# Patient Record
Sex: Male | Born: 1965
Health system: Southern US, Community
[De-identification: ages and names within clinical notes are randomized; demographics above are authoritative.]

## PROBLEM LIST (undated history)

## (undated) DIAGNOSIS — I10 Essential (primary) hypertension: Secondary | ICD-10-CM

## (undated) DIAGNOSIS — M1990 Unspecified osteoarthritis, unspecified site: Secondary | ICD-10-CM

## (undated) DIAGNOSIS — T7840XA Allergy, unspecified, initial encounter: Secondary | ICD-10-CM

## (undated) HISTORY — DX: Allergy, unspecified, initial encounter: T78.40XA

## (undated) HISTORY — DX: Unspecified osteoarthritis, unspecified site: M19.90

## (undated) HISTORY — DX: Essential (primary) hypertension: I10

---

## 2001-02-24 HISTORY — PX: CHOLECYSTECTOMY: SHX55

## 2016-06-03 ENCOUNTER — Ambulatory Visit (INDEPENDENT_AMBULATORY_CARE_PROVIDER_SITE_OTHER): Payer: 59 | Admitting: Family Medicine

## 2016-06-03 ENCOUNTER — Encounter: Payer: Self-pay | Admitting: Family Medicine

## 2016-06-03 VITALS — BP 140/84 | HR 99 | Resp 12 | Ht 70.0 in | Wt 203.4 lb

## 2016-06-03 DIAGNOSIS — G4733 Obstructive sleep apnea (adult) (pediatric): Secondary | ICD-10-CM | POA: Insufficient documentation

## 2016-06-03 DIAGNOSIS — I10 Essential (primary) hypertension: Secondary | ICD-10-CM

## 2016-06-03 DIAGNOSIS — E049 Nontoxic goiter, unspecified: Secondary | ICD-10-CM

## 2016-06-03 MED ORDER — AMLODIPINE BESYLATE 5 MG PO TABS: 5.0000 mg | ORAL_TABLET | Freq: Every day | ORAL | 0 refills | Status: DC

## 2016-06-03 NOTE — Progress Notes (Signed)
HPI:   [name] is a 51 y.o. male, who is here today to establish care with me.  Former PCP: Dr Omar Person Last preventive routine visit: 07/2015 pre-employment evaluation.He had lab work done but not sure about what was checked or results. He moved from [place] a year ago. He is a nurse,works with dialysis team.   Chronic medical problems: HTN,OSA,knee OA among some.    Concerns today: Elevated BP.   He has history of hypertension, he discontinued Amlodipine 10 mg about 8 months ago because his BP was well-controlled with nonpharmacologic treatment. He was running daily and following a low salt diet consistently.  For the past few months he has not been able to exercise due to bilateral knee pain, he has not been consistent with a healthy diet, as noted some weight gain. He is also following a high salt diet.  BP has been elevated for the past few days:140s 70/104, 130s/87, and yesterday right after doing yard work 117/70's.  No side effects reported with Amlodipine.  He has not noted visual changes, exertional chest pain, dyspnea,  focal weakness, or edema. Last eye exam June 2017. A few days ago he had a mild headache. For the past week or so he started with fatigue, which improved some with CPAP.  OSA: Diagnosed a few years ago. Last sleep study about 1-2 years ago. He stopped using CPAP because he felt more energetic and his wife noted he was sleeping well;this was after weight loss. He resumed CPAP  about a week ago because started with morning fatigue.  He has not tolerated CPAP, he feels like it interferes with sleep, he wakes up frequently during the night to re-accomodate mask.    Review of Systems  Constitutional: Positive for fatigue. Negative for activity change, appetite change, fever and unexpected weight change.  HENT: Negative for mouth sores, nosebleeds, sore throat and trouble swallowing.   Eyes: Negative for redness and visual  disturbance.  Respiratory: Negative for cough, shortness of breath and wheezing.   Cardiovascular: Negative for chest pain, palpitations and leg swelling.  Gastrointestinal: Negative for abdominal pain, nausea and vomiting.       No changes in bowel habits.  Genitourinary: Negative for decreased urine volume and hematuria.  Musculoskeletal: Positive for arthralgias (Hx of knee OA). Negative for gait problem and joint swelling.  Skin: Negative for rash.  Neurological: Negative for syncope, weakness and numbness.  Psychiatric/Behavioral: Positive for sleep disturbance. Negative for confusion. The patient is not nervous/anxious.       No current outpatient prescriptions on file prior to visit.   No current facility-administered medications on file prior to visit.      Past Medical History:  Diagnosis Date  . Arthritis    Knee OA  . Hypertension    No Known Allergies  Family History  Problem Relation Age of Onset  . Stroke Mother     aneurysm  . Hypertension Mother   . Hypertension Father     Social History   Social History  . Marital status: Married    Spouse name: N/A  . Number of children: N/A  . Years of education: N/A   Social History Main Topics  . Smoking status: Never Smoker  . Smokeless tobacco: Never Used  . Alcohol use Yes     Comment: Occasional  . Drug use: No  . Sexual activity: Not Asked   Other Topics Concern  . None   Social History Narrative  .  None    Vitals:   06/03/16 1146  BP: 140/84  Pulse: 99  Resp: 12  O2 sat 97% at RA.  Body mass index is 29.18 kg/m.   Physical Exam  Nursing note and vitals reviewed. Constitutional: He is oriented to person, place, and time. He appears well-developed. No distress.  HENT:  Head: Atraumatic.  Mouth/Throat: Oropharynx is clear and moist and mucous membranes are normal.  Eyes: Conjunctivae and EOM are normal. Pupils are equal, round, and reactive to light.  Neck: No tracheal deviation  present. Thyromegaly (? right thyroid.) present.  Cardiovascular: Normal rate and regular rhythm.   No murmur heard. Pulses:      Dorsalis pedis pulses are 2+ on the right side, and 2+ on the left side.  Respiratory: Effort normal and breath sounds normal. No respiratory distress.  GI: Soft. He exhibits no mass. There is no hepatomegaly. There is no tenderness.  Musculoskeletal: He exhibits edema (trace pitting edema,LE,bilateral.). He exhibits no tenderness.  Lymphadenopathy:    He has no cervical adenopathy.  Neurological: He is alert and oriented to person, place, and time. He has normal strength. No cranial nerve deficit. Coordination and gait normal.  Skin: Skin is warm. No erythema.  Psychiatric: He has a normal mood and affect.  Well groomed, good eye contact.      ASSESSMENT AND PLAN:   Erik Rubio was seen today for establish care and hypertension.  Diagnoses and all orders for this visit:  OSA (obstructive sleep apnea)  He is aware of adverse effects of sleep apnea when not treated adequately. Appointment with Dr. Waynette Buttery will be arranged. Encouraged to wear CPAP daily.   -     Ambulatory referral to Pulmonology  Hypertension, essential, benign  Mildly elevated. Possible complications of elevated BP discussed. Annual eye examination. Amlodipine 5 mg daily recommended. Low salt diet. He will keep me inform about BP readings through My Chart. I will se him back in 5-6 months.  -     Basic metabolic panel; Future -     TSH; Future -     Lipid panel; Future -     amLODipine (NORVASC) 5 MG tablet; Take 1 tablet (5 mg total) by mouth daily.  Enlarged thyroid gland  We discussed the findings on examination today. He would like to hold on thyroid imaging and proceed according to lab results.   -     TSH; Future        Betty G. Martinique, MD  [facility]. [place] office.

## 2016-06-03 NOTE — Progress Notes (Signed)
Pre visit review using our clinic review tool, if applicable. No additional management support is needed unless otherwise documented below in the visit note. 

## 2016-06-03 NOTE — Patient Instructions (Signed)
A few things to remember from today's visit:   OSA (obstructive sleep apnea) - Plan: Ambulatory referral to Pulmonology  Hypertension, essential, benign - Plan: Basic metabolic panel, TSH, Lipid panel, amLODipine (NORVASC) 5 MG tablet   DASH diet recommended: high in vegetables, fruits, low-fat dairy products, whole grains, poultry, fish, and nuts; and low in sweets, sugar-sweetened beverages, and red meats.  Blood pressure goal for most people is less than 140/90. Some populations (older than 60) the goal is less than 150/90.  Most recent cardiologists' recommendations recommend blood pressure at or less than 130/80.   Elevated blood pressure increases the risk of strokes, heart and kidney disease, and eye problems. Regular physical activity and a healthy diet (DASH diet) usually help. Low salt diet. Take medications as instructed.  Caution with some over the counter medications as cold medications, dietary products (for weight loss), and Ibuprofen or Aleve (frequent use);all these medications could cause elevation of blood pressure.   Please be sure medication list is accurate. If a new problem present, please set up appointment sooner than planned today.

## 2016-06-04 MED FILL — AMLODIPINE BESYLATE 5 MG TA: 5 | 90 days supply | Qty: 90 | Fill #0

## 2016-06-05 ENCOUNTER — Other Ambulatory Visit (INDEPENDENT_AMBULATORY_CARE_PROVIDER_SITE_OTHER): Payer: 59

## 2016-06-05 DIAGNOSIS — I10 Essential (primary) hypertension: Secondary | ICD-10-CM

## 2016-06-05 DIAGNOSIS — E049 Nontoxic goiter, unspecified: Secondary | ICD-10-CM | POA: Diagnosis not present

## 2016-06-05 LAB — LIPID PANEL
CHOLESTEROL: 201 mg/dL — AB (ref 0–200)
HDL: 64.9 mg/dL (ref >39.00)
LDL CALC: 122 mg/dL — AB (ref 0–99)
NonHDL: 135.75
Total CHOL/HDL Ratio: 3
Triglycerides: 67 mg/dL (ref 0.0–149.0)
VLDL: 13.4 mg/dL (ref 0.0–40.0)

## 2016-06-05 LAB — TSH: TSH: 0.66 u[IU]/mL (ref 0.35–4.50)

## 2016-06-05 LAB — BASIC METABOLIC PANEL
BUN: 17 mg/dL (ref 6–23)
CHLORIDE: 101 meq/L (ref 96–112)
CO2: 29 mEq/L (ref 19–32)
Calcium: 9.4 mg/dL (ref 8.4–10.5)
Creatinine, Ser: 1.09 mg/dL (ref 0.40–1.50)
GFR: 76.58 mL/min (ref >60.00)
Glucose, Bld: 88 mg/dL (ref 70–99)
Potassium: 4.3 mEq/L (ref 3.5–5.1)
Sodium: 138 mEq/L (ref 135–145)

## 2016-06-06 ENCOUNTER — Encounter: Payer: Self-pay | Admitting: Family Medicine

## 2016-06-08 ENCOUNTER — Encounter: Payer: Self-pay | Admitting: Family Medicine

## 2016-11-11 ENCOUNTER — Encounter: Payer: 59 | Admitting: Family Medicine

## 2016-11-13 ENCOUNTER — Encounter: Payer: Self-pay | Admitting: Family Medicine

## 2016-11-20 NOTE — Progress Notes (Signed)
HPI:  [name]. is a 51 y.o.male here today for his routine physical examination.  Last CPE: 07/2015 He lives with his wife and 2 children.  Regular exercise 3 or more times per week: Not consistently. Following a healthy diet: Yes   Chronic medical problems: HTN,OSA not wearing CPAP,knee OA, and mild HLD.  She has not been able to tolerate CPAP machine. He denies morning headache or fatigue.  Hx of STD's: Yes   -Denies high alcohol intake, tobacco use, or Hx of illicit drug use.  -Concerns and/or follow up today:   She is concerned about elevated BP at home, mainly diastolic numbers in the low 90s. He is not following a low-salt diet. He is currently on Amlodipine 5 mg daily.  Denies  headache, visual changes, chest pain, dyspnea, palpitation, claudication, focal weakness, or edema.  HLD: He is on non pharmacologic treatment. Last FLP otherwise normal. She tries to follow low fat diet.  He would like it to be re-checked.  Review of Systems  Constitutional: Negative for activity change, appetite change, fatigue, fever and unexpected weight change.  HENT: Negative for dental problem, nosebleeds, sore throat, trouble swallowing and voice change.   Eyes: Negative for redness and visual disturbance.  Respiratory: Negative for apnea, cough, shortness of breath and wheezing.   Cardiovascular: Negative for chest pain, palpitations and leg swelling.  Gastrointestinal: Negative for abdominal pain, blood in stool, nausea and vomiting.  Endocrine: Negative for cold intolerance, heat intolerance, polydipsia, polyphagia and polyuria.  Genitourinary: Negative for decreased urine volume, dysuria, genital sores, hematuria and testicular pain.  Musculoskeletal: Negative for gait problem and myalgias.  Skin: Negative for color change and rash.  Allergic/Immunologic: Negative for environmental allergies.  Neurological: Negative for dizziness, syncope,  weakness and headaches.  Hematological: Negative for adenopathy. Does not bruise/bleed easily.  Psychiatric/Behavioral: Negative for confusion and sleep disturbance. The patient is not nervous/anxious.   All other systems reviewed and are negative.    No current outpatient prescriptions on file prior to visit.   No current facility-administered medications on file prior to visit.      Past Medical History:  Diagnosis Date  . Arthritis    Knee OA  . Hypertension     Past Surgical History:  Procedure Laterality Date  . CHOLECYSTECTOMY  2003    No Known Allergies  Family History  Problem Relation Age of Onset  . Stroke Mother        aneurysm  . Hypertension Mother   . Hypertension Father     Social History   Social History  . Marital status: Married    Spouse name: N/A  . Number of children: N/A  . Years of education: N/A   Social History Main Topics  . Smoking status: Never Smoker  . Smokeless tobacco: Never Used  . Alcohol use Yes     Comment: Occasional  . Drug use: No  . Sexual activity: Not Asked   Other Topics Concern  . None   Social History Narrative  . None     Vitals:   11/21/16 0652 11/21/16 0722  BP: 124/80 (!) 136/92  Pulse: 85   Resp: 12   SpO2: 98%    Body mass index is 28.03 kg/m.   Wt Readings from Last 3 Encounters:  11/21/16 195 lb 6 oz (88.6 kg)  06/03/16 203 lb 6 oz (92.3 kg)     Physical Exam  Nursing note and vitals reviewed. Constitutional:  He is oriented to person, place, and time. He appears well-developed. No distress.  HENT:  Head: Normocephalic and atraumatic.  Right Ear: Hearing, tympanic membrane, external ear and ear canal normal.  Left Ear: Hearing, tympanic membrane, external ear and ear canal normal.  Mouth/Throat: Oropharynx is clear and moist and mucous membranes are normal.  Eyes: Pupils are equal, round, and reactive to light. Conjunctivae and EOM are normal.  Neck: Normal range of motion. No  tracheal deviation present. Thyromegaly (? right) present.  Cardiovascular: Normal rate and regular rhythm.   No murmur heard. Pulses:      Dorsalis pedis pulses are 2+ on the right side, and 2+ on the left side.  Respiratory: Effort normal and breath sounds normal. No respiratory distress.  GI: Soft. He exhibits no mass. There is no tenderness.  Genitourinary:  Genitourinary Comments: Refused,no concerns.  Musculoskeletal: He exhibits no edema or tenderness.  No major deformities appreciated and no signs of synovitis.  Lymphadenopathy:    He has no cervical adenopathy.       Right: No supraclavicular adenopathy present.       Left: No supraclavicular adenopathy present.  Neurological: He is alert and oriented to person, place, and time. He has normal strength. No cranial nerve deficit or sensory deficit. Coordination and gait normal.  Reflex Scores:      Bicep reflexes are 2+ on the right side and 2+ on the left side.      Patellar reflexes are 2+ on the right side and 2+ on the left side. Skin: Skin is warm. No rash noted. No erythema.  Psychiatric: His mood appears anxious. Cognition and memory are normal.  Well groomed, good eye contact.     ASSESSMENT AND PLAN:   Erik Rubio was seen today for annual exam.  Diagnoses and all orders for this visit:  Lab Results  Component Value Date   ALT 30 11/21/2016   AST 20 11/21/2016   ALKPHOS 73 11/21/2016   BILITOT 0.7 11/21/2016   Lab Results  Component Value Date   CREATININE 1.14 11/21/2016   BUN 13 11/21/2016   NA 136 11/21/2016   K 4.1 11/21/2016   CL 100 11/21/2016   CO2 28 11/21/2016   Lab Results  Component Value Date   HGBA1C 5.6 11/21/2016   Lab Results  Component Value Date   CHOL 187 11/21/2016   HDL 63.60 11/21/2016   LDLCALC 108 (H) 11/21/2016   TRIG 80.0 11/21/2016   CHOLHDL 3 11/21/2016    Routine general medical examination at a health care facility  We discussed the importance of regular  physical activity and healthy diet for prevention of chronic illness and/or complications. Preventive guidelines reviewed. Vaccination up to date.  The 10-year ASCVD risk score Mikey Bussing DC Brooke Bonito., et al., 2013) is: 2.5%   Values used to calculate the score:     Age: 47 years     Sex: Male     Is Non-Hispanic African American: No     Diabetic: No     Tobacco smoker: No     Systolic Blood Pressure: 169 mmHg     Is BP treated: Yes     HDL Cholesterol: 63.6 mg/dL     Total Cholesterol: 187 mg/dL  Next CPE in 1 year.  Hyperlipidemia, unspecified hyperlipidemia type  Continue low fat diet. Further recommendations will be given according to FLP results. F/U in 12 months.  -     Lipid panel  Diabetes mellitus screening -  Comprehensive metabolic panel -     Hemoglobin A1c  Enlarged thyroid gland  ? Right nodule. Asymptomatic. Further recommendations will be given according to imaging results.  -     US THYROID; Future  Hypertension, essential, benign  Mildly elevated.  Possible complications of elevated BP discussed. After discussion of options, he would like to increase dose of Amlodipine. Monitor BP at home. DASH-Low salt diet also recommended.  Annual eye examination. F/U in 6 months.  -     amLODipine (NORVASC) 10 MG tablet; Take 1 tablet (10 mg total) by mouth daily.    Return in 6 months (on 05/21/2017) for HTN.    Betty G. Martinique, MD  [facility]. [place] office.

## 2016-11-21 ENCOUNTER — Ambulatory Visit (INDEPENDENT_AMBULATORY_CARE_PROVIDER_SITE_OTHER): Payer: 59 | Admitting: Family Medicine

## 2016-11-21 ENCOUNTER — Encounter: Payer: Self-pay | Admitting: Family Medicine

## 2016-11-21 VITALS — BP 136/92 | HR 85 | Resp 12 | Ht 70.0 in | Wt 195.4 lb

## 2016-11-21 DIAGNOSIS — E049 Nontoxic goiter, unspecified: Secondary | ICD-10-CM

## 2016-11-21 DIAGNOSIS — Z0001 Encounter for general adult medical examination with abnormal findings: Secondary | ICD-10-CM

## 2016-11-21 DIAGNOSIS — E785 Hyperlipidemia, unspecified: Secondary | ICD-10-CM

## 2016-11-21 DIAGNOSIS — I10 Essential (primary) hypertension: Secondary | ICD-10-CM | POA: Diagnosis not present

## 2016-11-21 DIAGNOSIS — Z131 Encounter for screening for diabetes mellitus: Secondary | ICD-10-CM

## 2016-11-21 DIAGNOSIS — Z0000 Encounter for general adult medical examination without abnormal findings: Secondary | ICD-10-CM

## 2016-11-21 LAB — COMPREHENSIVE METABOLIC PANEL
ALBUMIN: 4.6 g/dL (ref 3.5–5.2)
ALT: 30 U/L (ref 0–53)
AST: 20 U/L (ref 0–37)
Alkaline Phosphatase: 73 U/L (ref 39–117)
BUN: 13 mg/dL (ref 6–23)
CALCIUM: 9.5 mg/dL (ref 8.4–10.5)
CHLORIDE: 100 meq/L (ref 96–112)
CO2: 28 meq/L (ref 19–32)
Creatinine, Ser: 1.14 mg/dL (ref 0.40–1.50)
GFR: 72.58 mL/min (ref >60.00)
Glucose, Bld: 96 mg/dL (ref 70–99)
POTASSIUM: 4.1 meq/L (ref 3.5–5.1)
SODIUM: 136 meq/L (ref 135–145)
Total Bilirubin: 0.7 mg/dL (ref 0.2–1.2)
Total Protein: 7.1 g/dL (ref 6.0–8.3)

## 2016-11-21 LAB — LIPID PANEL
CHOLESTEROL: 187 mg/dL (ref 0–200)
HDL: 63.6 mg/dL (ref >39.00)
LDL CALC: 108 mg/dL — AB (ref 0–99)
NonHDL: 123.87
Total CHOL/HDL Ratio: 3
Triglycerides: 80 mg/dL (ref 0.0–149.0)
VLDL: 16 mg/dL (ref 0.0–40.0)

## 2016-11-21 LAB — HEMOGLOBIN A1C: HEMOGLOBIN A1C: 5.6 % (ref 4.6–6.5)

## 2016-11-21 MED ORDER — AMLODIPINE BESYLATE 10 MG PO TABS: 10.0000 mg | ORAL_TABLET | Freq: Every day | ORAL | 3 refills | Status: DC

## 2016-11-21 MED FILL — AMLODIPINE BESYLATE 10 MG T: 10 | 90 days supply | Qty: 90 | Fill #0

## 2016-11-21 NOTE — Patient Instructions (Addendum)
A few things to remember from today's visit:   Diabetes mellitus screening - Plan: Comprehensive metabolic panel, Hemoglobin A1c  Hyperlipidemia, unspecified hyperlipidemia type - Plan: Lipid panel  Enlarged thyroid gland - Plan: US THYROID  Routine general medical examination at a health care facility  Amlodipine increased.    At least 150 minutes of moderate exercise per week, daily brisk walking for 15-30 min is a good exercise option. Healthy diet low in saturated (animal) fats and sweets and consisting of fresh fruits and vegetables, lean meats such as fish and white chicken and whole grains.  - Vaccines:  Tdap vaccine every 10 years.  Shingles vaccine recommended at age 51, could be given after 49 years of age but not sure about insurance coverage.  Pneumonia vaccines:  Prevnar 42 at 37 and Pneumovax at 6.   -Screening recommendations for low/normal risk males:  Screening for diabetes at age 70 and every 3 years. Earlier screening if cardiovascular risk factors.    Lipid screening at 35 and every 3 years. Screening starts in younger males with cardiovascular risk factors.   Colon cancer screening at age 63 and until age 35.  Prostate cancer screening: some controversy, starts usually at 75: Rectal exam and PSA.  Aortic Abdominal Aneurism once between 62 and 41 years old if even smoker.      Please be sure medication list is accurate. If a new problem present, please set up appointment sooner than planned today.

## 2016-11-23 ENCOUNTER — Encounter: Payer: Self-pay | Admitting: Family Medicine

## 2016-12-03 ENCOUNTER — Ambulatory Visit: Payer: 59 | Admitting: Family Medicine

## 2016-12-03 ENCOUNTER — Encounter: Payer: Self-pay | Admitting: Family Medicine

## 2016-12-03 ENCOUNTER — Ambulatory Visit (INDEPENDENT_AMBULATORY_CARE_PROVIDER_SITE_OTHER): Payer: 59 | Admitting: Family Medicine

## 2016-12-03 VITALS — BP 126/80 | HR 73 | Resp 12 | Ht 70.0 in | Wt 195.4 lb

## 2016-12-03 DIAGNOSIS — R200 Anesthesia of skin: Secondary | ICD-10-CM | POA: Diagnosis not present

## 2016-12-03 DIAGNOSIS — M549 Dorsalgia, unspecified: Secondary | ICD-10-CM | POA: Diagnosis not present

## 2016-12-03 DIAGNOSIS — I10 Essential (primary) hypertension: Secondary | ICD-10-CM

## 2016-12-03 MED ORDER — METHOCARBAMOL 750 MG PO TABS: 750.0000 mg | ORAL_TABLET | Freq: Three times a day (TID) | ORAL | 0 refills | Status: AC | PRN

## 2016-12-03 MED ORDER — CELECOXIB 100 MG PO CAPS: 100.0000 mg | ORAL_CAPSULE | Freq: Two times a day (BID) | ORAL | 0 refills | Status: DC

## 2016-12-03 MED ORDER — KETOROLAC TROMETHAMINE 60 MG/2ML IM SOLN
60.0000 mg | Freq: Once | INTRAMUSCULAR | Status: AC
  Administered 2016-12-03: 60 mg via INTRAMUSCULAR

## 2016-12-03 MED ORDER — PREDNISONE 20 MG PO TABS: ORAL_TABLET | ORAL | 0 refills | Status: AC

## 2016-12-03 MED ORDER — TRAMADOL HCL 50 MG PO TABS
50.0000 mg | ORAL_TABLET | Freq: Three times a day (TID) | ORAL | 0 refills | Status: DC | PRN
Start: 2016-12-03 — End: 2016-12-08

## 2016-12-03 MED FILL — predniSONE 20 MG TABS: 20 | 12 days supply | Qty: 20 | Fill #0

## 2016-12-03 MED FILL — METHOCARBAMOL 750 MG TABLET: 750 | 15 days supply | Qty: 45 | Fill #0

## 2016-12-03 MED FILL — traMADol HCL 50 MG TABS: 50 | 7 days supply | Qty: 21 | Fill #0

## 2016-12-03 MED FILL — CELECOXIB 100 MG CAP: 100 | 10 days supply | Qty: 20 | Fill #0

## 2016-12-03 NOTE — Progress Notes (Signed)
ACUTE VISIT   HPI:  Chief Complaint  Patient presents with  . upper back pain    [name] is a 51 y.o. male, who is here today with his wife complaining of constant left upper back pain.  Pain is started on 11/29/2016, a day after he Rubio working on cleaning his roof gutters at home. He does not remember pulling a muscle and denies any fall or injury. Pain got worse on 11/30/16.  No prior history of back pain.  Pain is left interscapular and radiated to LUE, dull/sharp like, 10/10 in intensity. Pain radiates to posterior aspect of left shoulder, arm,medial forearm,and 4th-5th finger.Pressure/numb like sensation. He also feel some weakness when grabbing objects with left hand.  Exacerbated by movement and when lying down, it is keeping him from sleep. Alleviated by rest. No rash or edema on area, fever, chills, or abnormal wt loss.  No limitation of ROM.  OTC medications: Motrin 800 mg tid ,last dose today 2 am. He also took his wife's Methocarbamol, she had leftover from prior Rx.  Pain is not better.  HTN: Recently Amlodipine Rubio increased from 5 to 10 mg daily. He has not checked BP lately.   Review of Systems  Constitutional: Positive for fatigue. Negative for appetite change, chills and fever.  HENT: Negative for sore throat and trouble swallowing.   Respiratory: Negative for cough, shortness of breath and wheezing.   Cardiovascular: Negative for chest pain.  Gastrointestinal: Negative for abdominal pain, nausea and vomiting.  Genitourinary: Negative for decreased urine volume and hematuria.  Musculoskeletal: Positive for back pain. Negative for gait problem and neck pain.  Skin: Negative for color change and rash.  Neurological: Negative for syncope, weakness, numbness and headaches.  Psychiatric/Behavioral: Positive for sleep disturbance. Negative for confusion. The patient is not nervous/anxious.       Current Outpatient  Prescriptions on File Prior to Visit  Medication Sig Dispense Refill  . amLODipine (NORVASC) 10 MG tablet Take 1 tablet (10 mg total) by mouth daily. 90 tablet 3   No current facility-administered medications on file prior to visit.      Past Medical History:  Diagnosis Date  . Arthritis    Knee OA  . Hypertension    No Known Allergies  Social History   Social History  . Marital status: Married    Spouse name: N/A  . Number of children: N/A  . Years of education: N/A   Social History Main Topics  . Smoking status: Never Smoker  . Smokeless tobacco: Never Used  . Alcohol use Yes     Comment: Occasional  . Drug use: No  . Sexual activity: Not Asked   Other Topics Concern  . None   Social History Narrative  . None    Vitals:   12/03/16 0935  BP: 126/80  Pulse: 73  Resp: 12  SpO2: 97%   Body mass index is 28.03 kg/m.  Physical Exam  Nursing note and vitals reviewed. Constitutional: He is oriented to person, place, and time. He appears well-developed. No distress.  HENT:  Head: Normocephalic and atraumatic.  Mouth/Throat: Oropharynx is clear and moist and mucous membranes are normal.  Eyes: Pupils are equal, round, and reactive to light. Conjunctivae are normal.  Cardiovascular: Normal rate and regular rhythm.   No murmur heard. Pulses:      Radial pulses are 2+ on the left side.  Respiratory: Effort normal and breath sounds normal. No respiratory  distress.  Musculoskeletal: He exhibits no edema.       Cervical back: He exhibits decreased range of motion. He exhibits no bony tenderness.       Thoracic back: He exhibits tenderness and spasm.       Back:  Minimal scoliosis appreciated on upper back. Tenderness upon palpation of left trapezium and interscapular left muscles.  Pain elicited with movement of shoulders and cervical ROM. Limitation of cervical extension due to pain, mild limited rotation and normal flexion. No local edema or erythema  appreciated, no suspicious lesions.   Lymphadenopathy:    He has no cervical adenopathy.  Neurological: He is alert and oriented to person, place, and time. He has normal strength. Coordination and gait normal.  Skin: Skin is warm. No rash noted. No erythema.  Psychiatric: His mood appears anxious.  Well groomed,good eye contact.    ASSESSMENT AND PLAN:   [name] seen today for upper back pain.  Diagnoses and all orders for this visit:  Acute upper back pain  ? Muscle strain. Because there is not a clear history of injury or blunt trauma, I don't think imaging is needed today. Here in the office and after verbal consent he received Toradol 60 mg IM. He will continue with Celebrex 100 mg twice daily for up to 10 days. Tramadol 50 mg to take mainly at night, side effects discussed. Topical Icy Hot or Asper cream OR local ice may also help. Instructed about warning signs. Cervical ROM exercises recommended. Follow-up as needed.  -     celecoxib (CELEBREX) 100 MG capsule; Take 1 capsule (100 mg total) by mouth 2 (two) times daily. -     methocarbamol (ROBAXIN) 750 MG tablet; Take 1 tablet (750 mg total) by mouth every 8 (eight) hours as needed for muscle spasms. -     traMADol (ULTRAM) 50 MG tablet; Take 1 tablet (50 mg total) by mouth every 8 (eight) hours as needed. -     ketorolac (TORADOL) injection 60 mg; Inject 2 mLs (60 mg total) into the muscle once.  Left arm numbness  ? Radicular pain. We discussed a few treatment options, including monitoring for now, he would like to try Prednisone taper. We discussed side effects. Instructed about warning signs. He is not better in 4-6 weeks we will need to consider cervical/upper thoracic MRI.  -     predniSONE (DELTASONE) 20 MG tablet; 3 tabs for 3 days, 2 tabs for 3 days, 1 tabs for 3 days, and 1/2 tab for 3 days. Take tables together with breakfast.  Hypertension, essential, benign  Adequately controlled. We  discussed side effects of NSAIDs. Recommended monitoring BP periodically. No changes in current management.   -[name] Rubio advised to seek immediate medical attention if sudden worsening symptoms or to follow if they persist or if new concerns arise.       Betty G. Martinique, MD  [facility]. [place] office.

## 2016-12-03 NOTE — Patient Instructions (Signed)
A few things to remember from today's visit:   Left arm numbness - Plan: predniSONE (DELTASONE) 20 MG tablet  Acute upper back pain - Plan: celecoxib (CELEBREX) 100 MG capsule, methocarbamol (ROBAXIN) 750 MG tablet, traMADol (ULTRAM) 50 MG tablet  Hypertension, essential, benign  Monitor blood pressure. If numbness still present in 4-6 weeks,please follow,we may need MRI.    Please be sure medication list is accurate. If a new problem present, please set up appointment sooner than planned today.

## 2016-12-08 ENCOUNTER — Encounter: Payer: Self-pay | Admitting: Family Medicine

## 2016-12-08 ENCOUNTER — Ambulatory Visit (INDEPENDENT_AMBULATORY_CARE_PROVIDER_SITE_OTHER): Payer: 59 | Admitting: Family Medicine

## 2016-12-08 VITALS — BP 160/90 | HR 102 | Resp 12 | Ht 70.0 in | Wt 195.1 lb

## 2016-12-08 DIAGNOSIS — M549 Dorsalgia, unspecified: Secondary | ICD-10-CM

## 2016-12-08 DIAGNOSIS — R200 Anesthesia of skin: Secondary | ICD-10-CM | POA: Diagnosis not present

## 2016-12-08 DIAGNOSIS — I10 Essential (primary) hypertension: Secondary | ICD-10-CM | POA: Diagnosis not present

## 2016-12-08 MED ORDER — HYDROCODONE-ACETAMINOPHEN 5-325 MG PO TABS: 1.0000 | ORAL_TABLET | Freq: Three times a day (TID) | ORAL | 0 refills | Status: AC | PRN

## 2016-12-08 MED ORDER — BENAZEPRIL HCL 10 MG PO TABS: 10.0000 mg | ORAL_TABLET | Freq: Every day | ORAL | 2 refills | Status: DC

## 2016-12-08 MED FILL — BENAZEPRIL HCL 10 MG TABLET: 10 | 30 days supply | Qty: 30 | Fill #0

## 2016-12-08 MED FILL — HYDROCODON-APAP 5-325: 5-325 | 7 days supply | Qty: 21 | Fill #0

## 2016-12-08 NOTE — Progress Notes (Signed)
HPI:   [name] is a 51 y.o. male, who is here today to follow on recent OV.   He was seen on 12/03/16 because upper back pain radiated to left shoulder and some numbness.  Pain started on 11/29/16. No Hx of direct trauma, he did pull a muscle on affected area while cleaning his roof gutters on 11/28/16.  No prior Hx of upper back pain or radiculopathy.  Celebrex 100 mg bid,Tramadol 50 mg tid,Prednisone taper, and Methocarbamol 750 mg tid were recommended. Tramadol is helping some with the pain but just for about 4 hours. Constant 3/10 when taking analgesics, 8-10/10 with no medication.  Numbness along left upper extremity (from posterior aspect of left shoulder to ulnar aspect of wrist and 4-5th fingers) is getting worse as well as grip weakness Pain is sharp exacerbated by laying down and with cervical movement. Alleviated by holding his left arm up. He denies chills, fever, skin rash, joint edema or erythema.  Pain is interfering with his sleep.  HTN: His BP is elevated today. BP readings at home BP 150/s/98. His currently on Amlodipine 10 mg daily. Denies severe/frequent headache, visual changes, chest pain, dyspnea, palpitation, or edema.   Lab Results  Component Value Date   CREATININE 1.14 11/21/2016   BUN 13 11/21/2016   NA 136 11/21/2016   K 4.1 11/21/2016   CL 100 11/21/2016   CO2 28 11/21/2016     Review of Systems  Constitutional: Positive for fatigue. Negative for activity change, appetite change, fever and unexpected weight change.  HENT: Negative for nosebleeds, sore throat and trouble swallowing.   Eyes: Negative for redness and visual disturbance.  Respiratory: Negative for cough, shortness of breath and wheezing.   Cardiovascular: Negative for chest pain, palpitations and leg swelling.  Gastrointestinal: Negative for abdominal pain, nausea and vomiting.  Genitourinary: Negative for decreased urine volume, dysuria and  hematuria.  Musculoskeletal: Positive for back pain and neck pain. Negative for gait problem.  Skin: Negative for pallor and rash.  Neurological: Positive for numbness. Negative for syncope, weakness and headaches.  Psychiatric/Behavioral: Positive for sleep disturbance. The patient is nervous/anxious.      Current Outpatient Prescriptions on File Prior to Visit  Medication Sig Dispense Refill  . amLODipine (NORVASC) 10 MG tablet Take 1 tablet (10 mg total) by mouth daily. 90 tablet 3  . methocarbamol (ROBAXIN) 750 MG tablet Take 1 tablet (750 mg total) by mouth every 8 (eight) hours as needed for muscle spasms. 45 tablet 0  . predniSONE (DELTASONE) 20 MG tablet 3 tabs for 3 days, 2 tabs for 3 days, 1 tabs for 3 days, and 1/2 tab for 3 days. Take tables together with breakfast. 20 tablet 0   No current facility-administered medications on file prior to visit.      Past Medical History:  Diagnosis Date  . Arthritis    Knee OA  . Hypertension    No Known Allergies  Social History   Social History  . Marital status: Married    Spouse name: N/A  . Number of children: N/A  . Years of education: N/A   Social History Main Topics  . Smoking status: Never Smoker  . Smokeless tobacco: Never Used  . Alcohol use Yes     Comment: Occasional  . Drug use: No  . Sexual activity: Not Asked   Other Topics Concern  . None   Social History Narrative  . None  Vitals:   12/08/16 1052 12/08/16 1133  BP: (!) 160/100 (!) 160/90  Pulse: (!) 102   Resp: 12   SpO2: 98%    Body mass index is 28 kg/m.  Physical Exam  Nursing note and vitals reviewed. Constitutional: He is oriented to person, place, and time. He appears well-developed. No distress.  HENT:  Head: Normocephalic and atraumatic.  Mouth/Throat: Oropharynx is clear and moist and mucous membranes are normal.  Eyes: Pupils are equal, round, and reactive to light. Conjunctivae are normal.  Cardiovascular: Normal rate and  regular rhythm.   No murmur heard. Pulses:      Radial pulses are 2+ on the right side, and 2+ on the left side.  Respiratory: Effort normal and breath sounds normal. No respiratory distress.  Musculoskeletal: He exhibits no edema.       Right shoulder: He exhibits normal range of motion and no tenderness.       Left shoulder: He exhibits normal range of motion and no tenderness.       Cervical back: He exhibits decreased range of motion. He exhibits no bony tenderness.  Cervical extension markedly limited, it elicits pain. Mild limitation of flexion and left sided rotation. Tenderness upon palpation of left trapezium and upper interscapular muscles. Mild muscular spasm. Rotator cuff maneuvers negative.    Lymphadenopathy:    He has no cervical adenopathy.  Neurological: He is alert and oriented to person, place, and time. He has normal strength. He displays no tremor. He exhibits normal muscle tone. Gait normal.  Grip left hand minimally weaker than right one but otherwise normal.  Skin: Skin is warm. No rash noted. No erythema.  Psychiatric: His mood appears anxious. Cognition and memory are normal.  Well groomed, good eye contact.    ASSESSMENT AND PLAN:  [name] was seen today for follow-up.  Diagnoses and all orders for this visit:  Hypertension, essential, benign  Not well controlled. Possible complications of elevated BP discussed. Benazepril 10 mg added today. No changes on Amlodipine. Continue monitoring BP at home. BMP in 7-10 days. Celebrex discontinued. Instructed about warning signs. BP check in 4 weeks.  -     benazepril (LOTENSIN) 10 MG tablet; Take 1 tablet (10 mg total) by mouth daily. -     Basic metabolic panel; Future  Upper back pain on left side  Cervical and upper thoracic. Celebrex discontinued. Since Tramadol is not helping much with pain, he will discontinue. Hydrocodone-Acetaminophen 5-325 mg tid as needed. He prefers to hold on ortho  referral. PT referral placed.  -     HYDROcodone-acetaminophen (NORCO/VICODIN) 5-325 MG tablet; Take 1 tablet by mouth every 8 (eight) hours as needed for moderate pain. -     Ambulatory referral to Physical Therapy  Left upper extremity numbness  Radicular pain. Complete Prednisone, side effects discussed. Instructed about warning signs. Cervical MRI will be arranged.  Note excuse for work extended.  -     MR Cervical Spine Wo Contrast; Future     Aariv Medlock G. Martinique, MD  [facility] [place] office.

## 2016-12-08 NOTE — Patient Instructions (Addendum)
A few things to remember from today's visit:   Hypertension, essential, benign - Plan: benazepril (LOTENSIN) 10 MG tablet, Basic metabolic panel  Upper back pain on left side - Plan: HYDROcodone-acetaminophen (NORCO/VICODIN) 5-325 MG tablet, Ambulatory referral to Physical Therapy  Left upper extremity numbness - Plan: MR Cervical Spine Wo Contrast  New medication: Benazepril and Hydrocodone. Stop Tramadol and Celebrex. Lab in 7-10 days.  Please be sure medication list is accurate. If a new problem present, please set up appointment sooner than planned today.

## 2016-12-11 ENCOUNTER — Ambulatory Visit: Payer: 59 | Attending: Family Medicine | Admitting: Physical Therapy

## 2016-12-11 DIAGNOSIS — M5413 Radiculopathy, cervicothoracic region: Secondary | ICD-10-CM | POA: Diagnosis not present

## 2016-12-11 DIAGNOSIS — M6281 Muscle weakness (generalized): Secondary | ICD-10-CM | POA: Insufficient documentation

## 2016-12-11 DIAGNOSIS — M542 Cervicalgia: Secondary | ICD-10-CM | POA: Diagnosis not present

## 2016-12-11 NOTE — Therapy (Signed)
[facility] [address], [place], [place], [address] Phone: [phone]   Fax:  [phone]  Physical Therapy Evaluation  Patient Details  Name: Erik Rubio. MRN: [id number] Date of Birth: 1965-03-29 Referring Provider: Dr. Betty Martinique  Encounter Date: 12/11/2016      PT End of Session - 12/11/16 0859    Visit Number 1   Date for PT Re-Evaluation 02/05/17   Authorization Type [facility] U[facility]   PT Start Time 0758   PT Stop Time 0850   PT Time Calculation (min) 52 min   Activity Tolerance Patient tolerated treatment well      Past Medical History:  Diagnosis Date  . Arthritis    Knee OA  . Hypertension     Past Surgical History:  Procedure Laterality Date  . CHOLECYSTECTOMY  2003    There were no vitals filed for this visit.       Subjective Assessment - 12/11/16 0800    Subjective (P)  I'm drowsy b/c of the pain medicine.  I was doing some house repairs (putting up siding).  Taking down scaffolding and was in a awkward position.  3 days after felt pain.  I couldn't move my neck, tingling in left UE.  Back painful as well.  Started on 12/01/16.  Symptoms no better.     Limitations (P)  House hold activities   Diagnostic tests (P)  none; waiting to be scheduled for MRI    Patient Stated Goals (P)  resume house repairs;  get back at work as nurse   Currently in Pain? (P)  Yes   Pain Score (P)  7    Pain Location (P)  Neck   Pain Orientation (P)  Left   Pain Type (P)  Acute pain   Pain Radiating Towards (P)  Radiates down arm to 4th and 5th fingers   Pain Onset (P)  1 to 4 weeks ago   Pain Frequency (P)  Constant   Aggravating Factors  (P)  looking up; night time   Pain Relieving Factors (P)  distract my mind;  Prednisone            [facility] PT Assessment - 12/11/16 0001      Assessment   Medical Diagnosis upper back pain left side   Referring Provider Dr. Betty Martinique   Onset  Date/Surgical Date 12/01/16   Hand Dominance Right   Next MD Visit March   Prior Therapy in [place] 5 years ago     Precautions   Precautions None     Restrictions   Weight Bearing Restrictions No     Balance Screen   Has the patient fallen in the past 6 months No   Has the patient had a decrease in activity level because of a fear of falling?  No   Is the patient reluctant to leave their home because of a fear of falling?  No     Home Environment   Living Environment Private residence   Type of [place]     Prior Function   Level of Independence Independent   Vocation Part time employment   Vocation Requirements hemodialysis nurse   Leisure play music, guitar, painting, house repairs     Observation/Other Assessments   Focus on Therapeutic Outcomes (FOTO)  53% limitation     Posture/Postural Control   Posture/Postural Control Postural limitations   Postural Limitations Rounded Shoulders;Forward head     AROM  AROM Assessment Site --  UE WFLs but pain with endrange shoulder movements   Cervical Flexion 40   Cervical Extension 20   Cervical - Right Side Bend 38   Cervical - Left Side Bend 20   Cervical - Right Rotation 48   Cervical - Left Rotation 40     Strength   Strength Assessment Site Elbow;Wrist;Hand   Left Shoulder Flexion 4/5   Left Shoulder Extension 4/5   Left Shoulder ABduction 4/5   Left Shoulder Internal Rotation 4/5   Left Shoulder External Rotation 4/5   Right/Left Elbow Left   Right/Left Wrist Left   Left Wrist Flexion 4/5   Left Wrist Extension 4-/5   Right/Left hand Left   Left Hand Grip (lbs) 55#  95# right     Palpation   Palpation comment no tender points today     Spurling's   Findings Negative     Distraction Test   Findngs Positive     other    Findings Positive   Side Left   Comment upper limb tension test            Objective measurements completed on examination: See above findings.          [place]  Adult PT Treatment/Exercise - 12/11/16 0001      Traction   Type of Traction Cervical   Min (lbs) 5   Max (lbs) 12   Hold Time 60   Rest Time 15   Time 10                PT Education - 12/11/16 0858    Education provided Yes   Education Details postural correction, use of lumbar roll with sitting;  cervical roll for sleeping   Person(s) Educated Patient   Methods Explanation;Demonstration;Handout   Comprehension Verbalized understanding;Returned demonstration          PT Short Term Goals - 12/11/16 0917      PT SHORT TERM GOAL #1   Title The patient will demonstrate initial self management strategies including postural correction, use of supports and stoplight system to avoid peripheralization of symptoms   Time 4   Period Weeks   Status New   Target Date 01/08/17     PT SHORT TERM GOAL #2   Title The patient will report centralized symptoms 50% of the time with usual ADLs   Time 4   Period Weeks   Status New     PT SHORT TERM GOAL #3   Title The patient will have improved cervical ROM with extension to 30 degrees and left sidebending to 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   Time 4   Period Weeks   Status New           PT [place] - 12/11/16 0921      PT [place] #1   Title The patient will be independent with self management and HEP for further improvement in pain, strength and return to function   Time 8   Period Weeks   Status New   Target Date 02/05/17     PT LONG TERM GOAL #2   Title The patient will report a decrease in central and peripheral UE pain and symptoms by 60%   Time 8   Period Weeks   Status New     PT LONG TERM GOAL #3   Title The patient will have improved left UE strength to 4+/5  and grip to 65# needed for playing the guitar   Time 8   Period Weeks   Status New     PT LONG TERM GOAL #4   Title Cervical ROM WFLs in all planes and UE ROM WFLs with minimal pain needed for work duties   Time 8    Period Weeks   Status New     PT LONG TERM GOAL #5   Title FOTO functional outcome score improved from 53% limitation to 33% limitation indicating improved function with less pain.     Time 8   Period Weeks   Status New                Plan - 12/11/16 0859    Clinical Impression Statement The patient reports a 2 week history of neck pain with constant pain, weakness and numbness in his left UE extending to his 4th and 5th fingers.  He attributes this to major home repairs and breaking down some scaffolding.  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  Pain is worse with extending his head and a night time.  Moderate head forward and rounded shoulders.    Decreased cervical ROM especially with extension, left sidebending and left rotation.  Decreased left UE strength grossly 4/5 and particularly in C8-T1 myotomes.  Decreased grip on left.  Sensation intact to light touch.  Positive upper limb tension test.  Positive distraction test.  He would benefit from PT to address these deficits.     History and Personal Factors relevant to plan of care: high blood pressure;  active lifestyle   Clinical Presentation Stable   Clinical Decision Making Low   Rehab Potential Good   PT Frequency 2x / week   PT Duration 8 weeks   PT Treatment/Interventions ADLs/Self Care Home Management;Electrical Stimulation;Cryotherapy;Ultrasound;Traction;Moist Heat;Patient/family education;Neuromuscular re-education;Therapeutic exercise;Therapeutic activities;Manual techniques;Taping;Dry needling   PT Next Visit Plan assess response to cervical traction and increase time if helpful;  try cervical retractions and/or sidebending to determine directional preference;  manual therapy;  modalities as needed   Consulted and Agree with Plan of Care Patient      Patient will benefit from skilled therapeutic intervention in order to improve the following deficits and  impairments:  Pain, Decreased range of motion, Decreased strength, Impaired UE functional use, Postural dysfunction, Decreased activity tolerance  Visit Diagnosis: Cervicalgia - Plan: PT plan of care cert/re-cert, PT plan of care cert/re-cert  Radiculopathy, cervicothoracic region - Plan: PT plan of care cert/re-cert, PT plan of care cert/re-cert  Muscle weakness (generalized) - Plan: PT plan of care cert/re-cert, PT plan of care cert/re-cert     Problem List Patient Active Problem List   Diagnosis Date Noted  . Hyperlipidemia 11/21/2016  . OSA (obstructive sleep apnea) 06/03/2016  . Hypertension, essential, benign 06/03/2016   Ruben Im, PT 12/11/16 6:24 PM Phone: [phone] Fax: [phone]  Alvera Singh 12/11/2016, 6:23 PM  [facility] [address] [place], [place], [address] Phone: [phone]   Fax:  [phone]  Name: Erik Rubio. MRN: [id number] Date of Birth: [birth year]

## 2016-12-15 ENCOUNTER — Other Ambulatory Visit (INDEPENDENT_AMBULATORY_CARE_PROVIDER_SITE_OTHER): Payer: 59

## 2016-12-15 ENCOUNTER — Ambulatory Visit: Payer: 59 | Admitting: Physical Therapy

## 2016-12-15 ENCOUNTER — Encounter: Payer: Self-pay | Admitting: Family Medicine

## 2016-12-15 DIAGNOSIS — M6281 Muscle weakness (generalized): Secondary | ICD-10-CM | POA: Diagnosis not present

## 2016-12-15 DIAGNOSIS — I10 Essential (primary) hypertension: Secondary | ICD-10-CM

## 2016-12-15 DIAGNOSIS — M5413 Radiculopathy, cervicothoracic region: Secondary | ICD-10-CM

## 2016-12-15 DIAGNOSIS — M542 Cervicalgia: Secondary | ICD-10-CM | POA: Diagnosis not present

## 2016-12-15 LAB — BASIC METABOLIC PANEL
BUN: 18 mg/dL (ref 6–23)
CALCIUM: 9.7 mg/dL (ref 8.4–10.5)
CHLORIDE: 98 meq/L (ref 96–112)
CO2: 27 meq/L (ref 19–32)
CREATININE: 1.09 mg/dL (ref 0.40–1.50)
GFR: 76.41 mL/min (ref >60.00)
GLUCOSE: 111 mg/dL — AB (ref 70–99)
Potassium: 3.8 mEq/L (ref 3.5–5.1)
Sodium: 135 mEq/L (ref 135–145)

## 2016-12-15 NOTE — Therapy (Signed)
[facility] [address], [place], [place], [address] Phone: [phone]   Fax:  [phone]  Physical Therapy Treatment  Patient Details  Name: Erik Rubio. MRN: [id number] Date of Birth: [birth year] Referring Provider: Dr. Betty Martinique  Encounter Date: 12/15/2016      PT End of Session - 12/15/16 0920    Visit Number 2   Date for PT Re-Evaluation 02/05/17   Authorization Type [facility] UMR   PT Start Time 0846   PT Stop Time 0929   PT Time Calculation (min) 43 min   Activity Tolerance Patient tolerated treatment well   Behavior During Therapy [facility] for tasks assessed/performed      Past Medical History:  Diagnosis Date  . Arthritis    Knee OA  . Hypertension     Past Surgical History:  Procedure Laterality Date  . CHOLECYSTECTOMY  2003    There were no vitals filed for this visit.      Subjective Assessment - 12/15/16 0848    Subjective Pt reports that his neck is improving some. He purchased a home traction machine which he has been using 2x/day for the past 2 days or so. He thinks this has helped some. Overall, his pain intensity and frequency seems to be less than it has been in the past.    Limitations House hold activities   Diagnostic tests none; waiting to be scheduled for MRI    Patient Stated Goals resume house repairs;  get back at work as nurse   Currently in Pain? Yes   Pain Score 5    Pain Location Thoracic   Pain Orientation Left   Pain Descriptors / Indicators Aching   Pain Type Chronic pain   Pain Radiating Towards Lt posterior shoulder and tricep region   Pain Onset 1 to 4 weeks ago   Pain Frequency Intermittent   Aggravating Factors  sleeping, trying to look down/slouch when working on things    Pain Relieving Factors home traction seems to be helping, correcting his posture   Effect of Pain on Daily Activities moderate limitation                           [facility] Adult PT Treatment/Exercise - 12/15/16 0001      Exercises   Exercises Neck     Neck Exercises: Seated   Neck Retraction 15 reps   Neck Retraction Limitations symptoms peripheralized to Lt hand   Shoulder Rolls Forwards;Backwards;20 reps   Other Seated Exercise thoracic rotation Lt and Rt x10 reps; thoracic extension mobilization over chair, from T7 to T3     Neck Exercises: Supine   Neck Retraction 15 reps;20 reps   Neck Retraction Limitations pain centralized to posterior shoulder after 10 reps,      Manual Therapy   Manual Therapy Myofascial release;Joint mobilization   Joint Mobilization Grade III-IV CPAs thoracic region from T2 to T5   Myofascial Release TPR Lt rhomboids, infraspinatus, upper trap                 PT Education - 12/15/16 0854    Education provided Yes   Education Details encouraged use of lumbar roll for posture correction at home, discussed gradual increase in time with home traction as long as symptoms are improving; addition to HEP; different causes for symptoms he is exeperiencing and benefits of finding exercises/positions that decrease symptoms    Person(s) Educated Patient  Methods Explanation;Verbal cues;Handout   Comprehension Verbalized understanding;Returned demonstration          PT Short Term Goals - 12/11/16 0917      PT SHORT TERM GOAL #1   Title The patient will demonstrate initial self management strategies including postural correction, use of supports and stoplight system to avoid peripheralization of symptoms   Time 4   Period Weeks   Status New   Target Date 01/08/17     PT SHORT TERM GOAL #2   Title The patient will report centralized symptoms 50% of the time with usual ADLs   Time 4   Period Weeks   Status New     PT SHORT TERM GOAL #3   Title The patient will have improved cervical ROM with extension to 30 degrees and left sidebending to 25 degrees 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   Time 4   Period Weeks   Status New           PT [place] - 12/11/16 0921      PT [place] #1   Title The patient will be independent with self management and HEP for further improvement in pain, strength and return to function   Time 8   Period Weeks   Status New   Target Date 02/05/17     PT LONG TERM GOAL #2   Title The patient will report a decrease in central and peripheral UE pain and symptoms by 60%   Time 8   Period Weeks   Status New     PT LONG TERM GOAL #3   Title The patient will have improved left UE strength to 4+/5  and grip to 65# needed for playing the guitar   Time 8   Period Weeks   Status New     PT LONG TERM GOAL #4   Title Cervical ROM WFLs in all planes and UE ROM WFLs with minimal pain needed for work duties   Time 8   Period Weeks   Status New     PT LONG TERM GOAL #5   Title FOTO functional outcome score improved from 53% limitation to 33% limitation indicating improved function with less pain.     Time 8   Period Weeks   Status New               Plan - 12/15/16 0933    Clinical Impression Statement Pt arrived reporting having bought a home traction machine for home. Overall, he is noting some improvements in his LUE symptoms, however his sleep continues to be limited due to the pain. Session focused on repeated cervical retraction movements, noting centralization of his symptoms during supine retraction. Pt also noted decrease in UE symptoms with thoracic mobilizations. Made additions to his HEP and encouraged increased use of the lumbar roll at home for improved posture awareness. He verbalized understanding at this time.    Rehab Potential Good   PT Frequency 2x / week   PT Duration 8 weeks   PT Treatment/Interventions ADLs/Self Care Home Management;Electrical Stimulation;Cryotherapy;Ultrasound;Traction;Moist Heat;Patient/family education;Neuromuscular re-education;Therapeutic exercise;Therapeutic  activities;Manual techniques;Taping;Dry needling   PT Next Visit Plan assess response to cervical traction at home and increase time if helpful;  try cervical sidebending to determine directional preference;  nerve glides UE, manual therapy to address thoracic/cervical mobility;  modalities as needed   PT Home Exercise Plan supine retraction, lumbar roll    Consulted and Agree with Plan of  Care Patient      Patient will benefit from skilled therapeutic intervention in order to improve the following deficits and impairments:  Pain, Decreased range of motion, Decreased strength, Impaired UE functional use, Postural dysfunction, Decreased activity tolerance  Visit Diagnosis: Cervicalgia  Radiculopathy, cervicothoracic region  Muscle weakness (generalized)     Problem List Patient Active Problem List   Diagnosis Date Noted  . Hyperlipidemia 11/21/2016  . OSA (obstructive sleep apnea) 06/03/2016  . Hypertension, essential, benign 06/03/2016   9:53 AM,12/15/16 Elly Modena PT, DPT [facility] [address], [place], [place], [address] Phone: [phone]   Fax:  [phone]  Name: Erik Rubio. MRN: [id number] Date of Birth: [birth year]

## 2016-12-15 NOTE — Patient Instructions (Addendum)
  Cervical Retractions  Tuck chin pressing head straight down into bed or mat table.    every 3 hours, x15 reps.

## 2016-12-17 ENCOUNTER — Ambulatory Visit: Payer: 59 | Admitting: Physical Therapy

## 2016-12-17 ENCOUNTER — Ambulatory Visit
Admission: RE | Admit: 2016-12-17 | Discharge: 2016-12-17 | Disposition: A | Discharge status: Home / Self Care | Payer: 59 | Source: Ambulatory Visit | Attending: Family Medicine | Admitting: Family Medicine

## 2016-12-17 DIAGNOSIS — M5413 Radiculopathy, cervicothoracic region: Secondary | ICD-10-CM | POA: Diagnosis not present

## 2016-12-17 DIAGNOSIS — M542 Cervicalgia: Secondary | ICD-10-CM

## 2016-12-17 DIAGNOSIS — E049 Nontoxic goiter, unspecified: Secondary | ICD-10-CM | POA: Diagnosis not present

## 2016-12-17 DIAGNOSIS — M6281 Muscle weakness (generalized): Secondary | ICD-10-CM | POA: Diagnosis not present

## 2016-12-17 NOTE — Patient Instructions (Addendum)
SUPINE ELASTIC BAND HORIZONTAL ABDUCTION  Lie on your back holding an elastic band up towards the ceiling. Next, pull your arms apart and towards the floor as shown.    x15 reps     Shoulder External Rotation Band Supine  Stretch band keeping elbows against sides and keeping elbows at 90 degrees.  [facility] reps   [facility] [address] [place], [place] [address] Phone # [phone] Fax [phone]

## 2016-12-17 NOTE — Therapy (Signed)
[facility] [address], [place], [place], [address] Phone: [phone]   Fax:  [phone]  Physical Therapy Treatment  Patient Details  Name: Erik Rubio. MRN: [id number] Date of Birth: [birth year] Referring Provider: Dr. Betty Martinique  Encounter Date: 12/17/2016      PT End of Session - 12/17/16 0814    Visit Number 3   Date for PT Re-Evaluation 02/05/17   Authorization Type [facility] UMR   PT Start Time 0806   PT Stop Time 0848   PT Time Calculation (min) 42 min   Activity Tolerance Patient tolerated treatment well;No increased pain   Behavior During Therapy WFL for tasks assessed/performed      Past Medical History:  Diagnosis Date  . Arthritis    Knee OA  . Hypertension     Past Surgical History:  Procedure Laterality Date  . CHOLECYSTECTOMY  2003    There were no vitals filed for this visit.      Subjective Assessment - 12/17/16 0808    Subjective Pt reports that things were rough again last night. He did try his new exercise and noted a decrease in his muscle spasm overall, but this did not seem to last long term. He took some medication this morning which seemed to also help with the spasm, but still rates his pain 6/10.    Limitations House hold activities   Diagnostic tests none; waiting to be scheduled for MRI    Patient Stated Goals resume house repairs;  get back at work as nurse   Currently in Pain? Yes   Pain Score 6    Pain Location Thoracic   Pain Orientation Left   Pain Descriptors / Indicators Aching   Pain Type Chronic pain   Pain Radiating Towards Lt posterior shoulder into Lt elbow    Pain Onset 1 to 4 weeks ago   Pain Frequency Constant   Aggravating Factors  sleeping, slouching   Pain Relieving Factors home traction, correcting his posture and chin tucks    Effect of Pain on Daily Activities moderate limitation                          [facility]  Adult PT Treatment/Exercise - 12/17/16 0001      Neck Exercises: Seated   Neck Retraction 15 reps   Neck Retraction Limitations no increase in symptoms     Neck Exercises: Supine   Other Supine Exercise Yellow TB: horizontal abduction while maintaining neck retraction x15 reps, B shoulder ER while maintaining neck retraction with towel squeeze x15 reps; D2 flexion with each UE while maintaining neck retraction x15 reps      Neck Exercises: Sidelying   Other Sidelying Exercise thoracic rotation x15 reps each direction     Manual Therapy   Manual Therapy Soft tissue mobilization   Joint Mobilization Grade III-IV cervical and thoracic CPAs from T8 to C5 x3 bouts    Soft tissue mobilization Lt thoracic paraspinals/upper trap                 PT Education - 12/17/16 0819    Education provided Yes   Education Details Progressed pt's HEP to sitting neck retraction every 2-3 hours for symptom response at next session; technique with therex   Person(s) Educated Patient   Methods Explanation;Verbal cues;Demonstration   Comprehension Verbalized understanding;Returned demonstration          PT Short Term Goals -  12/17/16 0943      PT SHORT TERM GOAL #1   Title The patient will demonstrate initial self management strategies including postural correction, use of supports and stoplight system to avoid peripheralization of symptoms   Time 4   Period Weeks   Status Achieved     PT SHORT TERM GOAL #2   Title The patient will report centralized symptoms 50% of the time with usual ADLs   Time 4   Period Weeks   Status On-going     PT SHORT TERM GOAL #3   Title The patient will have improved cervical ROM with extension to 30 degrees and left sidebending to 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   Time 4   Period Weeks   Status On-going           PT Long Term Goals - 12/11/16 0921      PT LONG TERM GOAL #1   Title The patient will be independent with self management  and HEP for further improvement in pain, strength and return to function   Time 8   Period Weeks   Status New   Target Date 02/05/17     PT LONG TERM GOAL #2   Title The patient will report a decrease in central and peripheral UE pain and symptoms by 60%   Time 8   Period Weeks   Status New     PT LONG TERM GOAL #3   Title The patient will have improved left UE strength to 4+/5  and grip to 65# needed for playing the guitar   Time 8   Period Weeks   Status New     PT LONG TERM GOAL #4   Title Cervical ROM WFLs in all planes and UE ROM WFLs with minimal pain needed for work duties   Time 8   Period Weeks   Status New     PT LONG TERM GOAL #5   Title FOTO functional outcome score improved from 53% limitation to 33% limitation indicating improved function with less pain.     Time 8   Period Weeks   Status New               Plan - 12/17/16 2774    Clinical Impression Statement Pt arrived today reporting improvements in his exercise tolerance at home since his last session. He was able to progress to upright cervical retraction without exacerbation of his symptoms and continues to slowly progress his cervical traction at home. Session focused initially on therex to promote proper cervical control during UE movement and increase thoracic mobility. Ended with mobilizations through the upper thoracic/lower cervical region which reduced the pt's numbness and pain symptoms to a 4/10 compared to 6/10 when he arrived. Will continue with this in future sessions to address symptoms and decrease pain.    Rehab Potential Good   PT Frequency 2x / week   PT Duration 8 weeks   PT Treatment/Interventions ADLs/Self Care Home Management;Electrical Stimulation;Cryotherapy;Ultrasound;Traction;Moist Heat;Patient/family education;Neuromuscular re-education;Therapeutic exercise;Therapeutic activities;Manual techniques;Taping;Dry needling   PT Next Visit Plan assess response to upright retraction at  home;  try cervical sidebending to determine directional preference;  nerve glides UE, manual therapy to address thoracic/cervical mobility;  modalities as needed   PT Home Exercise Plan supine retraction, lumbar roll, supine shoulder ER and horizontal abd with yellow TB    Consulted and Agree with Plan of Care Patient      Patient will benefit from  skilled therapeutic intervention in order to improve the following deficits and impairments:  Pain, Decreased range of motion, Decreased strength, Impaired UE functional use, Postural dysfunction, Decreased activity tolerance  Visit Diagnosis: Cervicalgia  Muscle weakness (generalized)  Radiculopathy, cervicothoracic region     Problem List Patient Active Problem List   Diagnosis Date Noted  . Hyperlipidemia 11/21/2016  . OSA (obstructive sleep apnea) 06/03/2016  . Hypertension, essential, benign 06/03/2016    9:44 AM,12/17/16 Elly Modena PT, DPT [facility] [address], [place], [place], [address] Phone: [phone]   Fax:  [phone]  Name: Erik Rubio. MRN: [id number] Date of Birth: [birth year]

## 2016-12-18 ENCOUNTER — Encounter: Payer: Self-pay | Admitting: Family Medicine

## 2016-12-21 ENCOUNTER — Other Ambulatory Visit: Payer: Self-pay | Admitting: Family Medicine

## 2016-12-21 DIAGNOSIS — M792 Neuralgia and neuritis, unspecified: Secondary | ICD-10-CM

## 2016-12-21 DIAGNOSIS — M542 Cervicalgia: Secondary | ICD-10-CM

## 2016-12-22 ENCOUNTER — Encounter: Payer: 59 | Admitting: Physical Therapy

## 2016-12-22 ENCOUNTER — Ambulatory Visit: Payer: 59 | Admitting: Physical Therapy

## 2016-12-22 ENCOUNTER — Encounter: Payer: Self-pay | Admitting: Family Medicine

## 2016-12-22 ENCOUNTER — Ambulatory Visit (INDEPENDENT_AMBULATORY_CARE_PROVIDER_SITE_OTHER): Payer: 59 | Admitting: Family Medicine

## 2016-12-22 VITALS — BP 130/76 | HR 100 | Temp 98.6°F | Resp 12 | Ht 70.0 in | Wt 196.4 lb

## 2016-12-22 DIAGNOSIS — M5413 Radiculopathy, cervicothoracic region: Secondary | ICD-10-CM

## 2016-12-22 DIAGNOSIS — M6281 Muscle weakness (generalized): Secondary | ICD-10-CM | POA: Diagnosis not present

## 2016-12-22 DIAGNOSIS — M792 Neuralgia and neuritis, unspecified: Secondary | ICD-10-CM

## 2016-12-22 DIAGNOSIS — M542 Cervicalgia: Secondary | ICD-10-CM | POA: Diagnosis not present

## 2016-12-22 DIAGNOSIS — M549 Dorsalgia, unspecified: Secondary | ICD-10-CM | POA: Diagnosis not present

## 2016-12-22 MED ORDER — OXYCODONE-ACETAMINOPHEN 5-325 MG PO TABS: 1.0000 | ORAL_TABLET | Freq: Three times a day (TID) | ORAL | 0 refills | Status: AC | PRN

## 2016-12-22 MED ORDER — CYCLOBENZAPRINE HCL 10 MG PO TABS: 10.0000 mg | ORAL_TABLET | Freq: Three times a day (TID) | ORAL | 0 refills | Status: DC | PRN

## 2016-12-22 MED ORDER — GABAPENTIN 400 MG PO CAPS: 400.0000 mg | ORAL_CAPSULE | Freq: Every day | ORAL | 1 refills | Status: DC

## 2016-12-22 MED FILL — CYCLOBENZAPRINE 10 MG TAB: 10 | 10 days supply | Qty: 30 | Fill #0

## 2016-12-22 MED FILL — GABAPENTIN 400 MG CAPSULE: 400 | 30 days supply | Qty: 30 | Fill #0

## 2016-12-22 MED FILL — OXYCODONE W/APAP 5/325 TAB: 5-325 | 5 days supply | Qty: 15 | Fill #0

## 2016-12-22 NOTE — Progress Notes (Signed)
HPI:   Erik Rubio. is a 51 y.o. male, who is here today with his wife c/o persistent left upper back pain and LUE tingling. Let upper back pain started on 11/29/16 a day after he did pull a muscle on affected area while he was cleaning his gutters.   Cervical MRI is pending. Recently referral to ortho was placed.  Cervical ROM has improved with PT, has had 2 sessions.  Left upper back spasm and pain not changed, severe, worse when lying down, it keeps him from sleep. Tramadol did not help. Hydrocodone helps for 3-4 hours. Methocarbamol 750 mg did not help, he increased dose to 1000 mg but helps for 4 hours.  Left cervical pain radiated to LUE, shooting pain down arm to 4-5th fingers.   Burning like pain on left upper back, tingling and numbness of LUE, grip weakness. He has not noted rash, fever,chills, or abnormal wt loss. + Fatigue.  He completed Prednisone taper.    Review of Systems  Constitutional: Positive for activity change and fatigue. Negative for appetite change and fever.  HENT: Negative for sore throat and trouble swallowing.   Respiratory: Negative for cough, shortness of breath and wheezing.   Cardiovascular: Negative for chest pain, palpitations and leg swelling.  Gastrointestinal: Negative for abdominal pain, nausea and vomiting.  Musculoskeletal: Positive for back pain and neck pain. Negative for joint swelling.  Skin: Negative for rash.  Neurological: Positive for weakness and numbness. Negative for syncope and headaches.  Hematological: Negative for adenopathy. Does not bruise/bleed easily.  Psychiatric/Behavioral: Positive for sleep disturbance. The patient is nervous/anxious.       Current Outpatient Prescriptions on File Prior to Visit  Medication Sig Dispense Refill  . amLODipine (NORVASC) 10 MG tablet Take 1 tablet (10 mg total) by mouth daily. 90 tablet 3  . benazepril (LOTENSIN) 10 MG tablet Take 1 tablet (10 mg  total) by mouth daily. 30 tablet 2   No current facility-administered medications on file prior to visit.      Past Medical History:  Diagnosis Date  . Arthritis    Knee OA  . Hypertension    No Known Allergies  Social History   Social History  . Marital status: Married    Spouse name: N/A  . Number of children: N/A  . Years of education: N/A   Social History Main Topics  . Smoking status: Never Smoker  . Smokeless tobacco: Never Used  . Alcohol use Yes     Comment: Occasional  . Drug use: No  . Sexual activity: Not Asked   Other Topics Concern  . None   Social History Narrative  . None    Vitals:   12/22/16 0858  BP: 130/76  Pulse: 100  Resp: 12  Temp: 98.6 F (37 C)  SpO2: 98%   Body mass index is 28.18 kg/m.    Physical Exam  Nursing note and vitals reviewed. Constitutional: He is oriented to person, place, and time. He appears well-developed. No distress.  HENT:  Head: Normocephalic and atraumatic.  Mouth/Throat: Oropharynx is clear and moist and mucous membranes are normal.  Eyes: Conjunctivae are normal.  Cardiovascular: Normal rate and regular rhythm.   No murmur heard. Pulses:      Radial pulses are 2+ on the right side, and 2+ on the left side.  Respiratory: Effort normal and breath sounds normal. No respiratory distress.  Musculoskeletal: He exhibits tenderness. He exhibits no edema.  Cervical back: He exhibits decreased range of motion and spasm (Left trapezium and interscalpular muscles.).       Thoracic back: He exhibits spasm.  Pain upon palpation of left trapezium, spasm. Also pain on suprascapular area and interscapular. No local edema or erythema appreciated, no suspicious lesions. Shoulder normal ROM, it does not elicit pain.  Lymphadenopathy:    He has no cervical adenopathy.       Right: No supraclavicular adenopathy present.       Left: No supraclavicular adenopathy present.  Neurological: He is alert and oriented to  person, place, and time. He has normal strength. Coordination and gait normal.  Skin: Skin is warm. No rash noted. No erythema.  Psychiatric: He has a normal mood and affect.  Well groomed,good eye contact.    ASSESSMENT AND PLAN:   Erik Rubio was seen today for left arm pain.  Diagnoses and all orders for this visit:  Upper back pain on left side  He has had pain for 3 weeks. Stop Methocarbamol and Hydrocodone. Percocet and Flexeril recommended. Side effects of medications discussed. Pending ortho appt.  -     cyclobenzaprine (FLEXERIL) 10 MG tablet; Take 1 tablet (10 mg total) by mouth 3 (three) times daily as needed for muscle spasms. -     oxyCODONE-acetaminophen (ROXICET) 5-325 MG tablet; Take 1 tablet by mouth every 8 (eight) hours as needed for severe pain.  Radicular pain of left upper extremity  He would like to try Gabapentin , will start 400 mg at bedime.We can titrate up as tolerated. Side effects discussed. Instructed about warning signs. Tomorrow cervical MRI.   -     gabapentin (NEURONTIN) 400 MG capsule; Take 1 capsule (400 mg total) by mouth at bedtime. -     oxyCODONE-acetaminophen (ROXICET) 5-325 MG tablet; Take 1 tablet by mouth every 8 (eight) hours as needed for severe pain.               Samina Weekes G. Martinique, MD  [facility]. [place] office.

## 2016-12-22 NOTE — Patient Instructions (Signed)
A few things to remember from today's visit:   Upper back pain on left side - Plan: cyclobenzaprine (FLEXERIL) 10 MG tablet, oxyCODONE-acetaminophen (ROXICET) 5-325 MG tablet  Radicular pain of left upper extremity - Plan: gabapentin (NEURONTIN) 400 MG capsule, oxyCODONE-acetaminophen (ROXICET) 5-325 MG tablet  Ortho referral was placed. Pending cervical MRI.   Please be sure medication list is accurate. If a new problem present, please set up appointment sooner than planned today.

## 2016-12-22 NOTE — Therapy (Signed)
[facility] [address], [place], [place], [address] Phone: [phone]   Fax:  [phone]  Physical Therapy Treatment  Patient Details  Name: Erik Rubio. MRN: [id number] Date of Birth: 1965/10/10 Referring Provider: Dr. Betty Martinique  Encounter Date: 12/22/2016      PT End of Session - 12/22/16 1407    Visit Number 4   Date for PT Re-Evaluation 02/05/17   Authorization Type [facility]MR   PT Start Time 1400   PT Stop Time 1440   PT Time Calculation (min) 40 min   Activity Tolerance Patient tolerated treatment well;No increased pain   Behavior During Therapy WFL for tasks assessed/performed      Past Medical History:  Diagnosis Date  . Arthritis    Knee OA  . Hypertension     Past Surgical History:  Procedure Laterality Date  . CHOLECYSTECTOMY  2003    There were no vitals filed for this visit.      Subjective Assessment - 12/22/16 1402    Subjective My ROM has improved alot.  I will have a MRI tomorrow. I still have the pinch and feel burn in upper left side down to wrist.    Limitations House hold activities   Diagnostic tests none; waiting to be scheduled for MRI    Patient Stated Goals resume house repairs;  get back at work as nurse   Currently in Pain? Yes   Pain Score 6    Pain Location Thoracic   Pain Orientation Left   Pain Descriptors / Indicators Aching;Burning   Pain Type Chronic pain   Pain Radiating Towards left posterior shoulder into left elbow   Pain Onset 1 to 4 weeks ago   Pain Frequency Constant   Aggravating Factors  sleeping, slouching   Pain Relieving Factors home traction, correcting his posture and chin tucks   Effect of Pain on Daily Activities moderate limitation   Multiple Pain Sites No                         [facility] Adult PT Treatment/Exercise - 12/22/16 0001      Neck Exercises: Supine   Other Supine Exercise Yellow TB: horizontal  abduction while maintaining neck retraction x15 reps, B shoulder ER while maintaining neck retraction with towel squeeze x15 reps; D2 flexion with each UE while maintaining neck retraction x15 reps      Modalities   Modalities Electrical Stimulation     Electrical Stimulation   Electrical Stimulation Location bil. sides of C6-T4   Electrical Stimulation Action electrical stim with dry needles   Electrical Stimulation Parameters 80 milliamps, to patient tolerance   Electrical Stimulation Goals Tone     Manual Therapy   Manual Therapy Soft tissue mobilization;Joint mobilization   Joint Mobilization P-A and rotational mobilization to T1-T4 grade 3   Soft tissue mobilization cervical paraspinals, bil upper trap, left rhomboids, left subscapularis          Trigger Point Dry Needling - 12/22/16 1414    Consent Given? Yes   Education Handout Provided Yes   Muscles Treated Upper Body Rhomboids;Subscapularis;Upper trapezius   Muscles Treated Lower Body --  C6-T4 multifidi bil.    Upper Trapezius Response Twitch reponse elicited;Palpable increased muscle length  bil   Rhomboids Response Twitch response elicited;Palpable increased muscle length  left   Subscapularis Response Twitch response elicited;Palpable increased muscle length  left  PT Education - 12/22/16 1418    Education provided Yes   Education Details dry needling info   Person(s) Educated Patient   Methods Explanation;Handout   Comprehension Verbalized understanding          PT Short Term Goals - 12/22/16 1508      PT SHORT TERM GOAL #1   Title The patient will demonstrate initial self management strategies including postural correction, use of supports and stoplight system to avoid peripheralization of symptoms   Time 4   Period Weeks   Status Achieved     PT SHORT TERM GOAL #2   Title The patient will report centralized symptoms 50% of the time with usual ADLs   Baseline still get tingling  into the pinky and ring finger   Time 4   Period Weeks   Status On-going     PT SHORT TERM GOAL #3   Title The patient will have improved cervical ROM with extension to 30 degrees and left sidebending to 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   Time 4   Period Weeks   Status On-going           PT Long Term Goals - 12/11/16 0921      PT LONG TERM GOAL #1   Title The patient will be independent with self management and HEP for further improvement in pain, strength and return to function   Time 8   Period Weeks   Status New   Target Date 02/05/17     PT LONG TERM GOAL #2   Title The patient will report a decrease in central and peripheral UE pain and symptoms by 60%   Time 8   Period Weeks   Status New     PT LONG TERM GOAL #3   Title The patient will have improved left UE strength to 4+/5  and grip to 65# needed for playing the guitar   Time 8   Period Weeks   Status New     PT LONG TERM GOAL #4   Title Cervical ROM WFLs in all planes and UE ROM WFLs with minimal pain needed for work duties   Time 8   Period Weeks   Status New     PT LONG TERM GOAL #5   Title FOTO functional outcome score improved from 53% limitation to 33% limitation indicating improved function with less pain.     Time 8   Period Weeks   Status New               Plan - 12/22/16 1458    Clinical Impression Statement Patient had good muscle twitches with dry needling. After dry needling and soft tissue work patient did no thave pain. Patient stands with increased flexion fo T1-T4 with decreased mobility of the vertebrae.  Patient has decreased mobility of the left scapula.Patient will benefit from skilled therapy  to improved mobility and function.     Rehab Potential Good   PT Frequency 2x / week   PT Duration 8 weeks   PT Treatment/Interventions ADLs/Self Care Home Management;Electrical Stimulation;Cryotherapy;Ultrasound;Traction;Moist Heat;Patient/family education;Neuromuscular  re-education;Therapeutic exercise;Therapeutic activities;Manual techniques;Taping;Dry needling   PT Next Visit Plan dry needling, soft tissue work to the cervical, upper trap, left scapula mobilization; see how MRI went; neural tension to left upper extremity for ulnar nerve and brachial plexus; measure cervical ROM   PT Home Exercise Plan progress as needed   Recommended Other Services MD signed initial eval  Consulted and Agree with Plan of Care Patient      Patient will benefit from skilled therapeutic intervention in order to improve the following deficits and impairments:  Pain, Decreased range of motion, Decreased strength, Impaired UE functional use, Postural dysfunction, Decreased activity tolerance  Visit Diagnosis: Cervicalgia  Muscle weakness (generalized)  Radiculopathy, cervicothoracic region     Problem List Patient Active Problem List   Diagnosis Date Noted  . Hyperlipidemia 11/21/2016  . OSA (obstructive sleep apnea) 06/03/2016  . Hypertension, essential, benign 06/03/2016    Earlie Counts, PT 12/22/16 3:28 PM    [facility] [address] [place], [place], [address] Phone: [phone]   Fax:  [phone]  Name: Athens Lebeau. MRN: [id number] Date of Birth: 05/05/1965

## 2016-12-22 NOTE — Patient Instructions (Signed)

## 2016-12-23 ENCOUNTER — Ambulatory Visit
Admission: RE | Admit: 2016-12-23 | Discharge: 2016-12-23 | Disposition: A | Discharge status: Home / Self Care | Payer: 59 | Source: Ambulatory Visit | Attending: Family Medicine | Admitting: Family Medicine

## 2016-12-23 DIAGNOSIS — R200 Anesthesia of skin: Secondary | ICD-10-CM

## 2016-12-23 DIAGNOSIS — M50223 Other cervical disc displacement at C6-C7 level: Secondary | ICD-10-CM | POA: Diagnosis not present

## 2016-12-24 ENCOUNTER — Ambulatory Visit: Payer: 59 | Admitting: Physical Therapy

## 2016-12-24 ENCOUNTER — Encounter: Payer: 59 | Admitting: Physical Therapy

## 2016-12-24 ENCOUNTER — Encounter: Payer: Self-pay | Admitting: Family Medicine

## 2016-12-24 DIAGNOSIS — M5413 Radiculopathy, cervicothoracic region: Secondary | ICD-10-CM | POA: Diagnosis not present

## 2016-12-24 DIAGNOSIS — M542 Cervicalgia: Secondary | ICD-10-CM

## 2016-12-24 DIAGNOSIS — M6281 Muscle weakness (generalized): Secondary | ICD-10-CM

## 2016-12-24 NOTE — Patient Instructions (Addendum)
Upper Limb Neural Tension: Median I    Stand with left palm flat on wall, fingers up. Bend elbow __0__ degrees and Hold _1___ seconds. Straighten elbow and Hold __1__ seconds. Repeat _5___ times per set. Do __1__ sets per session. Do __3__ sessions per day.  http://orth.exer.us/402   Copyright  VHI. All rights reserved.  Upper Limb Neural Tension: Radial III    Rotate left arm to direct thumb back and depress shoulder.  Hold __1__ seconds. Repeat __5__ times per set. Do _1___ sets per session. Do __3__ sessions per day. For further stretch, move arm away from body.  http://orth.exer.us/412   Copyright  VHI. All rights reserved.    Flexibility: Neck Retraction    Pull head straight back, keeping eyes and jaw level. Only go where there is not increase in pain or pulling .  Repeat __10__ times per set. Do every hour  http://orth.exer.us/344   Copyright  VHI. All rights reserved.   Head Tilt With Manual Assist    Place fingertips of left hand on right side of head and gently tilt head toward left shoulder. Keep other shoulder stationary. Hold _1__ seconds. Repeat _10__ times each side, alternating. Do every hour.   Copyright  VHI. All rights reserved.  Call MD to find out who the ortho doctor is.    [facility] [address] [place], [place] [address] Phone # [phone] Fax [phone]

## 2016-12-24 NOTE — Therapy (Signed)
[facility] [address], [place], [place], [address] Phone: [phone]   Fax:  [phone]  Physical Therapy Treatment  Patient Details  Name: Erik Rubio. MRN: [id number] Date of Birth: 08-Nov-1965 Referring Provider: Dr. Betty Martinique  Encounter Date: 12/24/2016      PT End of Session - 12/24/16 1529    Visit Number 5   Date for PT Re-Evaluation 02/05/17   Authorization Type [facility]   PT Start Time 2774   PT Stop Time 1528   PT Time Calculation (min) 43 min   Activity Tolerance Patient tolerated treatment well;No increased pain   Behavior During Therapy WFL for tasks assessed/performed      Past Medical History:  Diagnosis Date  . Arthritis    Knee OA  . Hypertension     Past Surgical History:  Procedure Laterality Date  . CHOLECYSTECTOMY  2003    There were no vitals filed for this visit.      Subjective Assessment - 12/24/16 1445    Subjective I just started Neurotin and I feel better a little.  I am not sure if it is the neurotin or dry needling. Pain as often.    Limitations House hold activities   Diagnostic tests none; waiting to be scheduled for MRI    Patient Stated Goals resume house repairs;  get back at work as nurse   Currently in Pain? Yes   Pain Score 6    Pain Location Neck   Pain Orientation Left   Pain Descriptors / Indicators Aching;Burning   Pain Type Chronic pain   Pain Radiating Towards left posterior shoulder into left elbow   Pain Onset 1 to 4 weeks ago   Pain Frequency Constant   Aggravating Factors  sleeping, slouching   Pain Relieving Factors home traction, correcting his posture and chin tucks   Multiple Pain Sites No                         [facility] Adult PT Treatment/Exercise - 12/24/16 0001      Modalities   Modalities Electrical Stimulation     Electrical Stimulation   Electrical Stimulation Location bil. sides of C6-T4    Electrical Stimulation Action electrical stim with dry needles   Electrical Stimulation Parameters 80 milliamps , to patient tolerance   Electrical Stimulation Goals Tone     Manual Therapy   Manual Therapy Soft tissue mobilization   Soft tissue mobilization cervical paraspinals, bil upper trap, left rhomboids, left subscapularis          Trigger Point Dry Needling - 12/24/16 1502    Consent Given? Yes   Muscles Treated Upper Body Rhomboids;Subscapularis;Upper trapezius   Muscles Treated Lower Body --  C5-T3 multifidi with electrical stim   Upper Trapezius Response Palpable increased muscle length;Twitch reponse elicited   Rhomboids Response Twitch response elicited;Palpable increased muscle length   Subscapularis Response Twitch response elicited;Palpable increased muscle length              PT Education - 12/24/16 1527    Education provided Yes   Education Details neural tension stretch; cervical retraction then sidebending to centralize the disc   Person(s) Educated Patient   Methods Explanation;Demonstration;Verbal cues;Handout   Comprehension Verbalized understanding;Returned demonstration          PT Short Term Goals - 12/24/16 1532      PT SHORT TERM GOAL #1   Title The  patient will demonstrate initial self management strategies including postural correction, use of supports and stoplight system to avoid peripheralization of symptoms   Time 4   Period Weeks   Status Achieved     PT SHORT TERM GOAL #2   Title The patient will report centralized symptoms 50% of the time with usual ADLs   Baseline still get tingling into the pinky and ring finger but is working on the [employer] of centralization   Time 4   Status On-going     PT SHORT TERM GOAL #3   Title The patient will have improved cervical ROM with extension to 30 degrees and left sidebending to 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   Time 4   Period Weeks   Status On-going            PT Driscoll - 12/11/16 0921      PT LONG TERM GOAL #1   Title The patient will be independent with self management and HEP for further improvement in pain, strength and return to function   Time 8   Period Weeks   Status New   Target Date 02/05/17     PT LONG TERM GOAL #2   Title The patient will report a decrease in central and peripheral UE pain and symptoms by 60%   Time 8   Period Weeks   Status New     PT LONG TERM GOAL #3   Title The patient will have improved left UE strength to 4+/5  and grip to 65# needed for playing the guitar   Time 8   Period Weeks   Status New     PT LONG TERM GOAL #4   Title Cervical ROM WFLs in all planes and UE ROM WFLs with minimal pain needed for work duties   Time 8   Period Weeks   Status New     PT LONG TERM GOAL #5   Title FOTO functional outcome score improved from 53% limitation to 33% limitation indicating improved function with less pain.     Time 8   Period Weeks   Status New               Plan - 12/24/16 1530    Clinical Impression Statement Therapy is focusing on Mckenzie method to centralize the disc and reduce symptoms in the left arm.  Dry needling and manual skills are to reduce the muscle tension to decrease pressure on the nerves.  After therapy patient is able stand with head more erect and decreased symptoms in left UE. Patient will benefit from skilled therapy to improve mobility and funciton.    Rehab Potential Good   PT Frequency 2x / week   PT Duration 8 weeks   PT Treatment/Interventions ADLs/Self Care Home Management;Electrical Stimulation;Cryotherapy;Ultrasound;Traction;Moist Heat;Patient/family education;Neuromuscular re-education;Therapeutic exercise;Therapeutic activities;Manual techniques;Taping;Dry needling   PT Next Visit Plan dry needling, soft tissue work to the cervical, upper trap, left scapula mobilization; see how MRI went; measure cervical ROM   PT Home Exercise Plan progress as needed    Consulted and Agree with Plan of Care Patient      Patient will benefit from skilled therapeutic intervention in order to improve the following deficits and impairments:  Pain, Decreased range of motion, Decreased strength, Impaired UE functional use, Postural dysfunction, Decreased activity tolerance  Visit Diagnosis: Cervicalgia  Muscle weakness (generalized)  Radiculopathy, cervicothoracic region     Problem List Patient Active Problem List  Diagnosis Date Noted  . Hyperlipidemia 11/21/2016  . OSA (obstructive sleep apnea) 06/03/2016  . Hypertension, essential, benign 06/03/2016    Earlie Counts, PT 12/24/16 3:33 PM   [facility] [address], [place], [place], [address] Phone: [phone]   Fax:  [phone]  Name: Dionysios Massman. MRN: [id number] Date of Birth: 1966/01/09

## 2016-12-30 ENCOUNTER — Encounter: Payer: Self-pay | Admitting: Physical Therapy

## 2016-12-30 ENCOUNTER — Ambulatory Visit: Payer: 59 | Attending: Family Medicine | Admitting: Physical Therapy

## 2016-12-30 ENCOUNTER — Ambulatory Visit (INDEPENDENT_AMBULATORY_CARE_PROVIDER_SITE_OTHER): Payer: 59 | Admitting: Physical Medicine and Rehabilitation

## 2016-12-30 ENCOUNTER — Encounter (INDEPENDENT_AMBULATORY_CARE_PROVIDER_SITE_OTHER): Payer: Self-pay | Admitting: Physical Medicine and Rehabilitation

## 2016-12-30 VITALS — BP 154/115 | HR 113

## 2016-12-30 DIAGNOSIS — M542 Cervicalgia: Secondary | ICD-10-CM | POA: Diagnosis not present

## 2016-12-30 DIAGNOSIS — M6281 Muscle weakness (generalized): Secondary | ICD-10-CM | POA: Insufficient documentation

## 2016-12-30 DIAGNOSIS — M5412 Radiculopathy, cervical region: Secondary | ICD-10-CM

## 2016-12-30 DIAGNOSIS — M4802 Spinal stenosis, cervical region: Secondary | ICD-10-CM

## 2016-12-30 DIAGNOSIS — M5413 Radiculopathy, cervicothoracic region: Secondary | ICD-10-CM | POA: Diagnosis not present

## 2016-12-30 DIAGNOSIS — M5010 Cervical disc disorder with radiculopathy, unspecified cervical region: Secondary | ICD-10-CM

## 2016-12-30 DIAGNOSIS — M549 Dorsalgia, unspecified: Secondary | ICD-10-CM | POA: Diagnosis not present

## 2016-12-30 MED ORDER — CYCLOBENZAPRINE HCL 10 MG PO TABS: 10.0000 mg | ORAL_TABLET | Freq: Three times a day (TID) | ORAL | 0 refills | Status: DC | PRN

## 2016-12-30 MED ORDER — DIAZEPAM 5 MG PO TABS: ORAL_TABLET | ORAL | 0 refills | Status: DC

## 2016-12-30 MED ORDER — OXYCODONE-ACETAMINOPHEN 5-325 MG PO TABS: 1.0000 | ORAL_TABLET | Freq: Three times a day (TID) | ORAL | 0 refills | Status: DC | PRN

## 2016-12-30 MED FILL — diazePAM 5 MG TABS: 5 | 1 days supply | Qty: 2 | Fill #0

## 2016-12-30 MED FILL — CYCLOBENZAPRINE 10 MG TAB: 10 | 10 days supply | Qty: 30 | Fill #0

## 2016-12-30 MED FILL — OXYCODONE W/APAP 5/325 TAB: 5-325 | 10 days supply | Qty: 30 | Fill #0

## 2016-12-30 NOTE — Patient Instructions (Addendum)
Side Pull: Double Arm    On back, knees bent, feet flat. Arms perpendicular to body, shoulder level, elbows straight but relaxed. Pull arms out to sides, elbows straight. Resistance band comes across collarbones, hands toward floor. Hold momentarily. Slowly return to starting position. Repeat _10__ times. No band    Copyright  VHI. All rights reserved.  Salute    On back, knees bent, feet flat, left hand on right hip, right hand above left. Pull resistance band up RIGHT side of body, elbow straight, toward floor with right hand. Hold momentarily. Slowly return to starting position. Repeat _10__ times. Do with left arm.    Copyright  VHI. All rights reserved.    SHOULDER: Flexion - Supine    Raise one arm overhead with palm up. _10__ reps per set, __1_ sets per day,  Copyright  VHI. All rights reserved.    Scapular Retraction: Elbow Flexion (Standing)   With elbows bent to 90, pinch shoulder blades together and rotate arms out, keeping elbows bent. Repeat _10___ times per set. Do _1___ sets per session. Do many__1__ sessions per day.   [facility] [address], [place], [place] [address] Phone # [phone] Fax [phone]

## 2016-12-30 NOTE — Therapy (Signed)
[facility] [address] [place], [place], [address] Phone: [phone]   Fax:  [phone]  Physical Therapy Treatment  Patient Details  Name: Erik Rubio. MRN: [id number] Date of Birth: [birth year] Referring Provider: Dr. Betty Martinique   Encounter Date: 12/30/2016  PT End of Session - 12/30/16 1524    Visit Number  6    Date for PT Re-Evaluation  02/05/17    Authorization Type  [facility]    PT Start Time  6160    PT Stop Time  1524    PT Time Calculation (min)  39 min    Activity Tolerance  Patient tolerated treatment well;No increased pain    Behavior During Therapy  WFL for tasks assessed/performed       Past Medical History:  Diagnosis Date  . Arthritis    Knee OA  . Hypertension     Past Surgical History:  Procedure Laterality Date  . CHOLECYSTECTOMY  2003    There were no vitals filed for this visit.  Subjective Assessment - 12/30/16 1448    Subjective  I still have pain in arm.  The neck is better and increased ROM. I still have the spasms and shoots pain to left arm and left wrist. Dr. Ernestina Patches said he want me to have an injection this Thursday.     Limitations  House hold activities    Diagnostic tests  none; waiting to be scheduled for MRI     Patient Stated Goals  resume house repairs;  get back at work as nurse    Currently in Pain?  Yes    Pain Score  3     Pain Location  Scapula    Pain Orientation  Left    Pain Descriptors / Indicators  Aching;Burning    Pain Type  Chronic pain    Pain Radiating Towards  left posterior shoulder into left elbow    Pain Onset  1 to 4 weeks ago    Pain Frequency  Intermittent    Aggravating Factors   sleep    Pain Relieving Factors  home traction, correcting his posture and chin tucks    Multiple Pain Sites  No                      [facility] Adult PT Treatment/Exercise - 12/30/16 0001      Neck Exercises: Standing   Other  Standing Exercises  bil. shoulder extension with chin retraction 15x      Neck Exercises: Supine   Other Supine Exercise  horizontal abduction no resistance with chin retraction      Manual Therapy   Manual Therapy  Soft tissue mobilization    Joint Mobilization  P-A mobilizatio to C6-T1 with cervical retractin    Soft tissue mobilization  left levator ani, rhomboid in sitting; supine to cervical paraspinals, suboccipitals and bil. sides of upper thoracic             PT Education - 12/30/16 1511    Education provided  Yes    Education Details  shoulder exercises in supine    Person(s) Educated  Patient    Methods  Explanation;Verbal cues;Handout    Comprehension  Verbalized understanding;Returned demonstration       PT Short Term Goals - 12/30/16 1527      PT SHORT TERM GOAL #2   Title  The patient will report centralized symptoms 50% of the time with usual  ADLs    Baseline  25% of the time    Time  4    Period  Weeks    Status  On-going      PT SHORT TERM GOAL #3   Title  The patient will have improved cervical ROM with extension to 30 degrees and left sidebending to 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    Baseline  not checking all movements due to centralizing pain    Time  4    Period  Weeks    Status  On-going        PT Long Term Goals - 12/11/16 0921      PT LONG TERM GOAL #1   Title  The patient will be independent with self management and HEP for further improvement in pain, strength and return to function    Time  8    Period  Weeks    Status  New    Target Date  02/05/17      PT LONG TERM GOAL #2   Title  The patient will report a decrease in central and peripheral UE pain and symptoms by 60%    Time  8    Period  Weeks    Status  New      PT LONG TERM GOAL #3   Title  The patient will have improved left UE strength to 4+/5  and grip to 65# needed for playing the guitar    Time  8    Period  Weeks    Status  New      PT LONG TERM  GOAL #4   Title  Cervical ROM WFLs in all planes and UE ROM WFLs with minimal pain needed for work duties    Time  8    Period  Weeks    Status  New      PT LONG TERM GOAL #5   Title  FOTO functional outcome score improved from 53% limitation to 33% limitation indicating improved function with less pain.      Time  8    Period  Weeks    Status  New            Plan - 12/30/16 1525    Clinical Impression Statement  Patient is going to have injection on Thursday.  Patient has decreased symptoms in left arm by 25%.  Patient is having less pain in cervical. Patient is to continue with the cervical retraction and  UE neurotension stretch.  Patient will benefit from skilled therapy to improve mobility and function.     Rehab Potential  Good    PT Frequency  2x / week    PT Duration  8 weeks    PT Treatment/Interventions  ADLs/Self Care Home Management;Electrical Stimulation;Cryotherapy;Ultrasound;Traction;Moist Heat;Patient/family education;Neuromuscular re-education;Therapeutic exercise;Therapeutic activities;Manual techniques;Taping;Dry needling    PT Next Visit Plan  see how the injections went, continue with scapula strength and add yellow band, check neural tension stretch to see if difference since last week.     PT Home Exercise Plan  progress as needed    Consulted and Agree with Plan of Care  Patient       Patient will benefit from skilled therapeutic intervention in order to improve the following deficits and impairments:  Pain, Decreased range of motion, Decreased strength, Impaired UE functional use, Postural dysfunction, Decreased activity tolerance  Visit Diagnosis: Cervicalgia  Muscle weakness (generalized)  Radiculopathy, cervicothoracic region     Problem  List Patient Active Problem List   Diagnosis Date Noted  . Hyperlipidemia 11/21/2016  . OSA (obstructive sleep apnea) 06/03/2016  . Hypertension, essential, benign 06/03/2016    Earlie Counts, PT 12/30/16  3:29 PM   [facility] [address], [place], [place], [address] Phone: [phone]   Fax:  [phone]  Name: Erik Rubio. MRN: [id number] Date of Birth: Nov 21, 1965

## 2016-12-30 NOTE — Progress Notes (Deleted)
Started having neck pain after working on siding of house. Pain posterior neck and down left arm to fourth and fifth finger. Difficulty sleeping at night due to pain. Some numbness from elbow to fourth and fifth fingers.  [name] is a 51 y.o. male, who is here today with his wife complaining of constant left upper back pain.  Pain is started on 11/29/2016, a day after he was working on cleaning his roof gutters at home. He does not remember pulling a muscle and denies any fall or injury. Pain got worse on 11/30/16.  No prior history of back pain.  Pain is left interscapular and radiated to LUE, dull/sharp like, 10/10 in intensity. Pain radiates to posterior aspect of left shoulder, arm,medial forearm,and 4th-5th finger.Pressure/numb like sensation. He also feel some weakness when grabbing objects with left hand.  Exacerbated by movement and when lying down, it is keeping him from sleep. Alleviated by rest.

## 2016-12-31 ENCOUNTER — Encounter (INDEPENDENT_AMBULATORY_CARE_PROVIDER_SITE_OTHER): Payer: Self-pay | Admitting: Physical Medicine and Rehabilitation

## 2016-12-31 NOTE — Progress Notes (Signed)
Azavion Millana Germaine Shenker. - 51 y.o. male MRN [id number]  Date of birth: 1965/03/01  Office Visit Note: Visit Date: 12/30/2016 PCP: Martinique, Betty G, MD Referred by: Martinique, Betty G, MD  Subjective: Chief Complaint  Patient presents with  . Neck - Pain  . Left Arm - Pain, Numbness  . Left Hand - Pain, Numbness   HPI: Mr. 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  He comes in today at the request of Dr. Martinique his primary care physician for worsening severe neck pain with radicular type symptoms in the left arm.  He reports that the pain started on 11/29/2016 which was on a Sunday after he was working on cleaning his gutters at home on the previous Friday.  He reports resting a section of gutter on his neck and then lifting it up and throw it off of a ladder.  He did not have any pain in the same day but on Sunday he has significant pain in the left neck and shoulder blade.  He rated this pain at the time is 10 out of 10.  It even worse in the next day.  It also extended down into the fourth and fifth digit of the hand.  At the time he had increased pain with movement and with lying down.  He still has increased pain with lying down but has had better movement of the cervical spine and arm after physical therapy.  Initially Dr. Martinique completed a good course of conservative care with anti-inflammatory medicine which was Celebrex as well as Robaxin.  He was using heat and ice as well.  He started physical therapy and then ultimately had a prednisone taper without much relief.  He was taking a small amount of oxycodone that seemed to help for 3-4 hours at a stretch.  He still having difficulty at night and difficulty during the day.  He reports overall his specific neck pain is somewhat better from physical therapy but overall he still having significant issues.  He does get a dull and sharp aching pain into the arm as well as a tingling numbness sensation  fourth and fifth digit on the left.  He denies any symptoms on the right.  He denies any focal weakness but is felt weaker on the left side.  MRI of the cervical spine was completed and this is reviewed below and I did review the images with the patient.  He has significant multilevel disc herniations that appear to have some age.  There is some ventral flattening but no cord signal changes.  He does have an issue at C7-T1 where there is a leftward extrusion which does appear to be new or this would cause a C8 nerve root symptom which is exactly what he is experiencing.  There is also some question of the abnormal appearance of the C6 vertebral body.  Patient has no history of cancer otherwise.  They did not feel it was clearly posttraumatic.  It is reported that this was called into Dr. Martinique but they did recommend considering evaluation with cervical spine CT or MRI with contrast.  He denies any fevers or chills or night sweats.  He denies any specific night pain other than getting to sleep.  He denies any unintended weight loss.  He is currently taking 400 mg of gabapentin at night and is been doing this for a week Flexeril during the day.  He does take ibuprofen on occasion.  Review of Systems  Constitutional: Negative for chills, fever, malaise/fatigue and weight loss.  HENT: Negative for hearing loss and sinus pain.   Eyes: Negative for blurred vision, double vision and photophobia.  Respiratory: Negative for cough and shortness of breath.   Cardiovascular: Negative for chest pain, palpitations and leg swelling.  Gastrointestinal: Negative for abdominal pain, nausea and vomiting.  Genitourinary: Negative for flank pain.  Musculoskeletal: Positive for neck pain. Negative for myalgias.       Left arm pain  Skin: Negative for itching and rash.  Neurological: Positive for tingling. Negative for tremors, focal weakness and weakness.  Endo/Heme/Allergies: Negative.   Psychiatric/Behavioral:  Negative for depression.  All other systems reviewed and are negative.  Otherwise per HPI.  Assessment & Plan: Visit Diagnoses:  1. Cervical radiculopathy   2. Cervical disc disorder with radiculopathy   3. Spinal stenosis of cervical region   4. Upper back pain on left side     Plan: Findings:  Severe worsening subacute now pain since the early part of October.  This pain is in the neck and left shoulder and arm which seems to be a pretty classic C8 radiculopathy.  Exam findings are little bit equivocal with an ulnar nerve but there is no mode of injury that would be consistent with that.  MRI evidence of significant disc herniations really throughout the upper cervical region and then at C7-T1 on the left which I think is what is causing his symptoms.  We had a long discussion about treatment of this with injection of the risk and benefits of epidural injection.  We also talked about the potential for cervical surgery.  He does not have any severe central stenosis although he has some flattening of the ventral aspect.  I think at this point the best recommendation would be a cervical epidural injection since he is really failed conservative care and his symptoms are really severe at times and is not get any sleep.  I have also agreed to give him a short course which is really a repeat of Percocet 5 mg to help him until we get the injection done.  We talked about this not being a chronic treatment.  We have talked about possibly increasing his gabapentin at some point but I would like to see how the injection does first.  He will continue with exercises that he has done in therapy.  I did refill his Flexeril.  He did say it helped more than the Robaxin.  Alternatively could look at baclofen.  We will see him for the injection which would be a C7-T1 possibly T1-2 interlaminar epidural steroid injection.    Meds & Orders:  Meds ordered this encounter  Medications  . diazepam (VALIUM) 5 MG tablet     Sig: Take 1 by mouth 1 to 2 hours pre-procedure. May repeat if necessary.    Dispense:  2 tablet    Refill:  0  . oxyCODONE-acetaminophen (PERCOCET/ROXICET) 5-325 MG tablet    Sig: Take 1 tablet every 8 (eight) hours as needed by mouth for severe pain.    Dispense:  30 tablet    Refill:  0  . cyclobenzaprine (FLEXERIL) 10 MG tablet    Sig: Take 1 tablet (10 mg total) 3 (three) times daily as needed by mouth for muscle spasms.    Dispense:  30 tablet    Refill:  0   No orders of the defined types were placed in this encounter.   Follow-up: Return  for Left C7-T1 or T1-2 interlaminar epidural steroid injection.   Procedures: No procedures performed  No notes on file   Clinical History: MRI CERVICAL SPINE WITHOUT CONTRAST  TECHNIQUE: Multiplanar, multisequence MR imaging of the cervical spine was performed. No intravenous contrast was administered.  COMPARISON:  None.  FINDINGS: Alignment: Straightening of the normal cervical lordosis. No subluxation.  Vertebrae: Abnormal C6 vertebral body, mildly heterogeneous T2 and STIR hyperintense, T1 hypointense. This is not clearly a posttraumatic abnormality. There is no loss of height or wedging. Cortical margins are grossly intact. A primary bone lesion or metastatic deposit could have this appearance. It is possible this represents an atypical hemangioma, with a striated appearance on axial imaging, although there is a paucity of fat. Prominent esophagus anterior to C6 simulates prevertebral soft tissue swelling.  Cord: Cord flattening at multiple levels, due to disc disease, exacerbated by short pedicles, at C3-4, C4-5, C5-6, C6-7, and C7-T1.  Posterior Fossa, vertebral arteries, paraspinal tissues: No tonsillar herniation. Flow voids are maintained in the vertebral arteries. No neck masses. Query interspinous ligament edema and C7-T1 see series 4 image 8.  Disc levels:  C2-3: Central and leftward extrusion. Slight  cord flattening. LEFT C3 foraminal narrowing.  C3-4: Broad-based central protrusion. Short pedicles. Stenosis with cord flattening. BILATERAL C4 foraminal narrowing.  C4-5: Broad-based central protrusion. Cord flattening. Short pedicles. No C5 foraminal narrowing.  C5-6: Central protrusion. Cord flattening. Short pedicles. No C6 foraminal narrowing.  C6-7: Central and rightward protrusion. RIGHT-sided cord flattening short pedicles. RIGHT C7 foraminal narrowing.  C7-T1: Central and leftward extrusion extends into the foramen. Slight cord flattening. Significant LEFT C8 nerve root compression.  IMPRESSION: The dominant LEFT-sided abnormality is at C7-T1 where a central and leftward disc extrusion extends into the foramen. In the slight cord flattening with significant LEFT C8 nerve root compression.  Multilevel disc disease elsewhere, in conjunction with short pedicles, results in varying degrees of spinal stenosis and cord flattening, without abnormal cord signal. This is most pronounced at C4-C5.  Abnormal appearance to the C6 vertebral body is indeterminate. A metastatic deposit could have this appearance, but there is no history of cancer. This could represent a primary bone lesion. Atypical hemangioma is less favored but not excluded. The appearance is not clearly posttraumatic, and the patient does not have a significant history of trauma. Consider further evaluation with cervical spine CT scanning without contrast. Post infusion MRI due imaging could also be performed, using fat saturation techniques.  These results will be called to the ordering clinician or representative by the Radiologist Assistant, and communication documented in the PACS or zVision Dashboard.   Electronically Signed   By: Staci Righter M.D.   On: 12/23/2016 16:47  He reports that  has never smoked. he has never used smokeless tobacco.  Recent Labs    11/21/16 0735  HGBA1C 5.6     Objective:  VS:  HT:    WT:   BMI:     BP:(!) 154/115  HR:(!) 113bpm  TEMP: ( )  RESP:  Physical Exam  Constitutional: He is oriented to person, place, and time. He appears well-developed and well-nourished. No distress.  HENT:  Head: Normocephalic and atraumatic.  Nose: Nose normal.  Mouth/Throat: Oropharynx is clear and moist.  Eyes: Conjunctivae are normal. Pupils are equal, round, and reactive to light.  Neck: Normal range of motion. Neck supple. No tracheal deviation present.  Cardiovascular: Regular rhythm and intact distal pulses.  Pulmonary/Chest: Effort normal and breath sounds normal.  Abdominal: Soft. He exhibits no distension.  Musculoskeletal: He exhibits no deformity.  Examination of the cervical spine shows fairly good range of motion but it in range of extension rotation he has pain on the left.  He does have active trigger points in the levator scapula and supraspinatus and trapezius.  He has no shoulder impingement signs bilaterally.  He has good strength bilaterally in the upper extremities except there appears to be 4 out of 5 strength with finger abduction on the left.  Interestingly is a positive Tinel's over the left elbow.  Sensation is decreased in a C8 distribution but is pretty close to an ulnar distribution with the fourth digit or sensation on the radial side.  He has an equivocal Spurling's test on the left.  He has a negative Hoffman's test.  Lymphadenopathy:    He has no cervical adenopathy.  Neurological: He is alert and oriented to person, place, and time. He exhibits normal muscle tone. Coordination normal.  Skin: Skin is warm. No rash noted.  Psychiatric: He has a normal mood and affect. His behavior is normal.  Nursing note and vitals reviewed.   Ortho Exam Imaging: No results found.  Past Medical/Family/Surgical/Social History: Medications & Allergies reviewed per EMR Patient Active Problem List   Diagnosis Date Noted  . Hyperlipidemia  11/21/2016  . OSA (obstructive sleep apnea) 06/03/2016  . Hypertension, essential, benign 06/03/2016   Past Medical History:  Diagnosis Date  . Arthritis    Knee OA  . Hypertension    Family History  Problem Relation Age of Onset  . Stroke Mother        aneurysm  . Hypertension Mother   . Hypertension Father    Past Surgical History:  Procedure Laterality Date  . CHOLECYSTECTOMY  2003   Social History   Occupational History  . Not on file  Tobacco Use  . Smoking status: Never Smoker  . Smokeless tobacco: Never Used  Substance and Sexual Activity  . Alcohol use: Yes    Comment: Occasional  . Drug use: No  . Sexual activity: Not on file

## 2017-01-01 ENCOUNTER — Encounter (INDEPENDENT_AMBULATORY_CARE_PROVIDER_SITE_OTHER): Payer: Self-pay | Admitting: Physical Medicine and Rehabilitation

## 2017-01-01 ENCOUNTER — Encounter: Payer: 59 | Admitting: Physical Therapy

## 2017-01-01 ENCOUNTER — Ambulatory Visit (INDEPENDENT_AMBULATORY_CARE_PROVIDER_SITE_OTHER): Payer: Self-pay

## 2017-01-01 ENCOUNTER — Ambulatory Visit (INDEPENDENT_AMBULATORY_CARE_PROVIDER_SITE_OTHER): Payer: 59 | Admitting: Physical Medicine and Rehabilitation

## 2017-01-01 VITALS — BP 151/89 | HR 98 | Temp 98.4°F

## 2017-01-01 DIAGNOSIS — M4802 Spinal stenosis, cervical region: Secondary | ICD-10-CM | POA: Diagnosis not present

## 2017-01-01 DIAGNOSIS — M5010 Cervical disc disorder with radiculopathy, unspecified cervical region: Secondary | ICD-10-CM

## 2017-01-01 DIAGNOSIS — M5412 Radiculopathy, cervical region: Secondary | ICD-10-CM | POA: Diagnosis not present

## 2017-01-01 DIAGNOSIS — M549 Dorsalgia, unspecified: Secondary | ICD-10-CM

## 2017-01-01 MED ORDER — BETAMETHASONE SOD PHOS & ACET 6 (3-3) MG/ML IJ SUSP
12.0000 mg | Freq: Once | INTRAMUSCULAR | Status: AC
  Administered 2017-01-01: 12 mg

## 2017-01-01 MED ORDER — LIDOCAINE HCL (PF) 1 % IJ SOLN: 2.0000 mL | Freq: Once | INTRAMUSCULAR | Status: DC

## 2017-01-01 NOTE — Procedures (Signed)
[name] is a 51 year old right-hand-dominant gentleman who recently saw for evaluation of his chronic worsening left neck shoulder and arm pain.  We are going to complete a left C7-T1 interlaminar steroid injection.  Please see our prior evaluation and management note for further details and justification.  Cervical Epidural Steroid Injection - Interlaminar Approach with Fluoroscopic Guidance  Patient: Erik Rubio.      Date of Birth: 1965/08/18 MRN: [id number] PCP: Martinique, Betty G, MD      Visit Date: 01/01/2017   Universal Protocol:    Date/Time: 11/08/189:30 AM  Consent Given By: the patient  Position: PRONE  Additional Comments: Vital signs were monitored before and after the procedure. Patient was prepped and draped in the usual sterile fashion. The correct patient, procedure, and site was verified.   Injection Procedure Details:  Procedure Site One Meds Administered:  Meds ordered this encounter  Medications  . lidocaine (PF) (XYLOCAINE) 1 % injection 2 mL  . betamethasone acetate-betamethasone sodium phosphate (CELESTONE) injection 12 mg     Laterality: Left  Location/Site:  C7-T1  Needle size: 20 G  Needle type: Touhy  Needle Placement: Paramedian epidural space  Findings:  -Contrast Used: 0.5 mL iohexol 180 mg iodine/mL   -Comments: Excellent flow of contrast into the epidural space.  Procedure Details: Using a paramedian approach from the side mentioned above, the region overlying the inferior lamina was localized under fluoroscopic visualization and the soft tissues overlying this structure were infiltrated with 4 ml. of 1% Lidocaine without Epinephrine. A # 20 gauge, Tuohy needle was inserted into the epidural space using a paramedian approach.  The epidural space was localized using loss of resistance along with lateral and contralateral oblique bi-planar fluoroscopic views.  After negative aspirate for air, blood, and CSF, a 2 ml.  volume of Isovue-250 was injected into the epidural space and the flow of contrast was observed. Radiographs were obtained for documentation purposes.   The injectate was administered into the level noted above.  Additional Comments:  The patient tolerated the procedure well Dressing: Band-Aid    Post-procedure details: Patient was observed during the procedure. Post-procedure instructions were reviewed.  Patient left the clinic in stable condition.

## 2017-01-01 NOTE — Patient Instructions (Signed)

## 2017-01-01 NOTE — Progress Notes (Deleted)
Here for planned C7-T1 IL. No changes.

## 2017-01-06 ENCOUNTER — Encounter: Payer: Self-pay | Admitting: Physical Therapy

## 2017-01-06 ENCOUNTER — Ambulatory Visit: Payer: 59 | Admitting: Physical Therapy

## 2017-01-06 DIAGNOSIS — M542 Cervicalgia: Secondary | ICD-10-CM

## 2017-01-06 DIAGNOSIS — M5413 Radiculopathy, cervicothoracic region: Secondary | ICD-10-CM

## 2017-01-06 DIAGNOSIS — M6281 Muscle weakness (generalized): Secondary | ICD-10-CM

## 2017-01-06 NOTE — Therapy (Signed)
[facility] [address], [place], [place], [address] Phone: [phone]   Fax:  [phone]  Physical Therapy Treatment  Patient Details  Name: Erik Rubio. MRN: [id number] Date of Birth: Apr 20, 1965 Referring Provider: Dr. Betty Martinique   Encounter Date: 01/06/2017  PT End of Session - 01/06/17 1524    Visit Number  7    Date for PT Re-Evaluation  02/05/17    Authorization Type  [facility]    PT Start Time  3007    PT Stop Time  1523    PT Time Calculation (min)  38 min    Activity Tolerance  Patient tolerated treatment well;No increased pain    Behavior During Therapy  WFL for tasks assessed/performed       Past Medical History:  Diagnosis Date  . Arthritis    Knee OA  . Hypertension     Past Surgical History:  Procedure Laterality Date  . CHOLECYSTECTOMY  2003    There were no vitals filed for this visit.  Subjective Assessment - 01/06/17 1449    Subjective  I had the steroid injection.  It has helped me with my left arm pain.  I still have burning in the back of my neck. I feel some weakness in the left arm. I have trouble squeezing with my left hand.     Limitations  House hold activities    Diagnostic tests  none; waiting to be scheduled for MRI     Patient Stated Goals  resume house repairs;  get back at work as nurse    Currently in Pain?  Yes    Pain Score  4     Pain Location  Neck    Pain Orientation  Left    Pain Descriptors / Indicators  Aching;Burning    Pain Type  Chronic pain    Pain Onset  1 to 4 weeks ago    Pain Frequency  Intermittent    Aggravating Factors   at night    Pain Relieving Factors  home traction , correcting hi sposture and chin tucks    Effect of Pain on Daily Activities  moderate limitation    Multiple Pain Sites  No         [facility] PT Assessment - 01/06/17 0001      Strength   Left Shoulder Flexion  4/5    Left Shoulder Extension  4+/5    Left  Shoulder Internal Rotation  5/5    Left Shoulder External Rotation  4/5    Left Wrist Flexion  5/5    Left Wrist Extension  4/5                  [facility] Adult PT Treatment/Exercise - 01/06/17 0001      Neck Exercises: Seated   Neck Retraction  10 reps no change in pain    Other Seated Exercise  cervical extension 10x with no increase in pain             PT Education - 01/06/17 1518    Education provided  Yes    Education Details  cervial extension; left arm strength with blue band    Person(s) Educated  Patient    Methods  Explanation;Demonstration;Verbal cues;Handout    Comprehension  Verbalized understanding;Returned demonstration       PT Short Term Goals - 01/06/17 1527      PT SHORT TERM GOAL #1   Title  The patient will demonstrate initial self management strategies including postural correction, use of supports and stoplight system to avoid peripheralization of symptoms    Time  4    Period  Weeks    Status  Achieved      PT SHORT TERM GOAL #2   Title  The patient will report centralized symptoms 50% of the time with usual ADLs    Time  4    Period  Weeks    Status  Achieved      PT SHORT TERM GOAL #3   Title  The patient will have improved cervical ROM with extension to 30 degrees and left sidebending to 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    Baseline  not checking all movements due to centralizing pain    Time  4    Period  Weeks    Status  On-going        PT Long Term Goals - 12/11/16 0921      PT LONG TERM GOAL #1   Title  The patient will be independent with self management and HEP for further improvement in pain, strength and return to function    Time  8    Period  Weeks    Status  New    Target Date  02/05/17      PT LONG TERM GOAL #2   Title  The patient will report a decrease in central and peripheral UE pain and symptoms by 60%    Time  8    Period  Weeks    Status  New      PT LONG TERM GOAL #3   Title  The  patient will have improved left UE strength to 4+/5  and grip to 65# needed for playing the guitar    Time  8    Period  Weeks    Status  New      PT LONG TERM GOAL #4   Title  Cervical ROM WFLs in all planes and UE ROM WFLs with minimal pain needed for work duties    Time  8    Period  Weeks    Status  New      PT LONG TERM GOAL #5   Title  FOTO functional outcome score improved from 53% limitation to 33% limitation indicating improved function with less pain.      Time  8    Period  Weeks    Status  New            Plan - 01/06/17 1524    Clinical Impression Statement  Patient has increased strength of left upper extremity but it is not full.  Patient has weakness with his pincer grip. Patient pain is more in his neck since he has had the spinal injections.  Patient has learned exercises to increase left upper extremity strength.  Patient is now ready for the next step in McKenzie progression with cervical extension to further centralize the disc. Patient will benefit from skilled therapy to reduce pain and improve function.     Rehab Potential  Good    PT Frequency  2x / week    PT Duration  8 weeks    PT Treatment/Interventions  ADLs/Self Care Home Management;Electrical Stimulation;Cryotherapy;Ultrasound;Traction;Moist Heat;Patient/family education;Neuromuscular re-education;Therapeutic exercise;Therapeutic activities;Manual techniques;Taping;Dry needling    PT Next Visit Plan  continue with scapula strength , check neural tension stretch to see if difference since last week. Patient can schedule further  appointments, check cervical ROM exercises    PT Home Exercise Plan  progress as needed    Consulted and Agree with Plan of Care  Patient       Patient will benefit from skilled therapeutic intervention in order to improve the following deficits and impairments:  Pain, Decreased range of motion, Decreased strength, Impaired UE functional use, Postural dysfunction, Decreased  activity tolerance  Visit Diagnosis: Cervicalgia  Muscle weakness (generalized)  Radiculopathy, cervicothoracic region     Problem List Patient Active Problem List   Diagnosis Date Noted  . Hyperlipidemia 11/21/2016  . OSA (obstructive sleep apnea) 06/03/2016  . Hypertension, essential, benign 06/03/2016    Earlie Counts, PT 01/06/17 3:29 PM    [facility] [address] [place], [place], [address] Phone: [phone]   Fax:  [phone]  Name: Damarien Nyman. MRN: [id number] Date of Birth: 29-Dec-1965

## 2017-01-06 NOTE — Patient Instructions (Addendum)
Purchase putty or finger strengthener    Start by holding an elastic band across your chest with the unaffected arm.   Next, pull the band downward with the other arm so that the elbow goes from a bent position to a straightened position as shown. 30 times 1 time per day    While seated and holding an elastic band with both hands, draw up your hand by bending at the elbow.   Note, your opposite hand should fixate the elastic band on your knee.30 times 1 time per day    While holding an elastic band with both arms in front of you with your elbows straight, squeeze your shoulder blades together as you pull the band back. Be sure your shoulders do not raise up. 30 times 1 time per day.     While holding an elastic band at your side with your elbow bent, start with your hand near your stomach and then pull the band away. Keep your elbow at your side the entire time. 30 times 1 time per day    Sit or Standing holding a theraband in your hands, palms facing up. Keep your elbows pulled in at your sides and squeeze your shoulder blades back and together, allowing your arms to rotate out to the side. 30 times 1 time per day.     Stand with your arms by your sides, thumbs pointed up. Active your lower traps as you raise your arms straight in front of you and overhead. Only raise arms as high as you can without letting your shoulder shrug. Perform in front of a mirror to watch for your shoulders to remain level. 15 times 1 time per day.    Begin standing against wall with elbows abducted to 90 degrees and shoulder blades retracted (pulled in and down) and cervical spine in a chin tuck. Without shrugging shoulders, slide bent arms up the wall as if making a snow angel. Return to starting position without allowing shoulder blades to protract 15 times 1 time per day.     First do the retraction 10 times then look upward 10 times 2 times per day. Do not do first thing in thing in the morning.    [facility] [address] [place], [place] [address] Phone # [phone] Fax [phone]

## 2017-01-07 MED FILL — BENAZEPRIL HCL 10 MG TABLET: 10 | 30 days supply | Qty: 30 | Fill #1

## 2017-01-08 ENCOUNTER — Ambulatory Visit: Payer: 59 | Admitting: Physical Therapy

## 2017-01-08 ENCOUNTER — Encounter: Payer: Self-pay | Admitting: Physical Therapy

## 2017-01-08 DIAGNOSIS — M5413 Radiculopathy, cervicothoracic region: Secondary | ICD-10-CM

## 2017-01-08 DIAGNOSIS — M6281 Muscle weakness (generalized): Secondary | ICD-10-CM | POA: Diagnosis not present

## 2017-01-08 DIAGNOSIS — M542 Cervicalgia: Secondary | ICD-10-CM | POA: Diagnosis not present

## 2017-01-08 NOTE — Therapy (Signed)
[facility] [address], [place], [place], [address] Phone: [phone]   Fax:  [phone]  Physical Therapy Treatment  Patient Details  Name: Erik Rubio. MRN: [id number] Date of Birth: 01/22/1966 Referring Provider: Dr. Betty Martinique   Encounter Date: 01/08/2017  PT End of Session - 01/08/17 1606    Visit Number  8    Date for PT Re-Evaluation  02/05/17    Authorization Type  [facility] UMR    PT Start Time  1093    PT Stop Time  1608    PT Time Calculation (min)  38 min    Activity Tolerance  Patient tolerated treatment well;No increased pain    Behavior During Therapy  WFL for tasks assessed/performed       Past Medical History:  Diagnosis Date  . Arthritis    Knee OA  . Hypertension     Past Surgical History:  Procedure Laterality Date  . CHOLECYSTECTOMY  2003    There were no vitals filed for this visit.  Subjective Assessment - 01/08/17 1535    Subjective  Last night I woke up at 12 midnight and had pain on the left scapula.  I still have some tingling and weakness in my left arm.  I ordered the putty for my hands.     Limitations  House hold activities    Diagnostic tests  none; waiting to be scheduled for MRI     Patient Stated Goals  resume house repairs;  get back at work as nurse    Currently in Pain?  Yes    Pain Score  6     Pain Location  Neck    Pain Orientation  Left    Pain Descriptors / Indicators  Aching;Burning    Pain Type  Chronic pain    Pain Onset  1 to 4 weeks ago    Pain Frequency  Intermittent    Aggravating Factors   at night    Pain Relieving Factors  home traction, correcting his posture and chin tucks         [facility] PT Assessment - 01/08/17 0001      AROM   Cervical Flexion  55    Cervical Extension  55    Cervical - Right Side Bend  40    Cervical - Left Side Bend  40    Cervical - Right Rotation  75    Cervical - Left Rotation  65                   [facility] Adult PT Treatment/Exercise - 01/08/17 0001      Neck Exercises: Supine   Shoulder Flexion  10 reps;Left;Right with head retraction; then 10x with yellow band    Other Supine Exercise  supine bil. shoulder flexion with yellow band 10x; horizontal abduction wiith yellow band; diagonals wiht yellow band; wide grip with bringing hads overhead with yellow band 10x      Manual Therapy   Manual Therapy  Other (comment);Soft tissue mobilization;Joint mobilization    Joint Mobilization  side glide to right side of C4-T1    Soft tissue mobilization  cervical and upper thoracic paraspinals, and interscapular in supine    Other Manual Therapy  neural tension stretch on the left UE               PT Short Term Goals - 01/08/17 1542      PT SHORT TERM GOAL #  3   Title  The patient will have improved cervical ROM with extension to 30 degrees and left sidebending to 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    Time  4    Period  Weeks    Status  Achieved        PT Long Term Goals - 12/11/16 0921      PT LONG TERM GOAL #1   Title  The patient will be independent with self management and HEP for further improvement in pain, strength and return to function    Time  8    Period  Weeks    Status  New    Target Date  02/05/17      PT LONG TERM GOAL #2   Title  The patient will report a decrease in central and peripheral UE pain and symptoms by 60%    Time  8    Period  Weeks    Status  New      PT LONG TERM GOAL #3   Title  The patient will have improved left UE strength to 4+/5  and grip to 65# needed for playing the guitar    Time  8    Period  Weeks    Status  New      PT LONG TERM GOAL #4   Title  Cervical ROM WFLs in all planes and UE ROM WFLs with minimal pain needed for work duties    Time  8    Period  Weeks    Status  New      PT LONG TERM GOAL #5   Title  FOTO functional outcome score improved from 53% limitation to 33% limitation  indicating improved function with less pain.      Time  8    Period  Weeks    Status  New            Plan - 01/08/17 1539    Clinical Impression Statement  After therapy patient has no pain.  Patient had improved left UE neural tension stretch after was done in therapy. Patient has increased cervical ROM.  Patient pain is now intermittent and less tingling in left UE. Patient will benefit from skilled therapy to reduce pain and improve function.     Rehab Potential  Good    PT Frequency  2x / week    PT Duration  8 weeks    PT Treatment/Interventions  ADLs/Self Care Home Management;Electrical Stimulation;Cryotherapy;Ultrasound;Traction;Moist Heat;Patient/family education;Neuromuscular re-education;Therapeutic exercise;Therapeutic activities;Manual techniques;Taping;Dry needling    PT Next Visit Plan  continue with scapula strength     PT Home Exercise Plan  progress as needed       Patient will benefit from skilled therapeutic intervention in order to improve the following deficits and impairments:  Pain, Decreased range of motion, Decreased strength, Impaired UE functional use, Postural dysfunction, Decreased activity tolerance  Visit Diagnosis: Cervicalgia  Muscle weakness (generalized)  Radiculopathy, cervicothoracic region     Problem List Patient Active Problem List   Diagnosis Date Noted  . Hyperlipidemia 11/21/2016  . OSA (obstructive sleep apnea) 06/03/2016  . Hypertension, essential, benign 06/03/2016    Earlie Counts, PT 01/08/17 4:10 PM   [facility] [address] [place], [place], [address] Phone: [phone]   Fax:  [phone]  Name: Berwyn Bigley. MRN: [id number] Date of Birth: 12-01-1965

## 2017-01-13 ENCOUNTER — Ambulatory Visit: Payer: 59 | Admitting: Physical Therapy

## 2017-01-13 ENCOUNTER — Encounter: Payer: Self-pay | Admitting: Physical Therapy

## 2017-01-13 DIAGNOSIS — M5413 Radiculopathy, cervicothoracic region: Secondary | ICD-10-CM | POA: Diagnosis not present

## 2017-01-13 DIAGNOSIS — M6281 Muscle weakness (generalized): Secondary | ICD-10-CM | POA: Diagnosis not present

## 2017-01-13 DIAGNOSIS — M542 Cervicalgia: Secondary | ICD-10-CM

## 2017-01-13 NOTE — Patient Instructions (Addendum)
Scapular Retraction: Bilateral    Facing anchor, pull arms back, bringing shoulder blades together. Repeat _20___ times per set. Do ___1_ sets per session. Do __1__ sessions per day.  http://orth.exer.us/176   Copyright  VHI. All rights reserved.  ULNAR NERVE: Mobilization IV    Place right hand upside down over ear. Do _1__ sets of __5_ repetitions per session. Do _1__ sessions per day.  Copyright  VHI. All rights reserved.  MEDIAN NERVE: Flossing III    With yellow band around right shoulder and opposite knee, depress shoulder by using knee to tighten band, hand and fingers pulled back, thumb toward floor. Simultaneously move arm to shoulder level and tilt head toward same side. Do _1__ sets of _5__ repetitions per session. Do __1-2_ sessions per day.  Copyright  VHI. All rights reserved.  [facility] [address], [place],  [address] Phone # [phone] Fax [phone]

## 2017-01-13 NOTE — Therapy (Signed)
[facility] [address], [place], [place], [address] Phone: [phone]   Fax:  [phone]  Physical Therapy Treatment  Patient Details  Name: Erik Rubio. MRN: [id number] Date of Birth: 1965/06/16 Referring Provider: Dr. Betty Martinique   Encounter Date: 01/13/2017  PT End of Session - 01/13/17 1527    Visit Number  9    Date for PT Re-Evaluation  02/05/17    Authorization Type  [facility] UMR    PT Start Time  5053    PT Stop Time  1523    PT Time Calculation (min)  38 min    Activity Tolerance  Patient tolerated treatment well;No increased pain    Behavior During Therapy  WFL for tasks assessed/performed       Past Medical History:  Diagnosis Date  . Arthritis    Knee OA  . Hypertension     Past Surgical History:  Procedure Laterality Date  . CHOLECYSTECTOMY  2003    There were no vitals filed for this visit.  Subjective Assessment - 01/13/17 1447    Subjective  I feel a little better. I had to have my blood pressurermedicaiton changed due to coughing more and stirred up my neck pain.     Limitations  House hold activities    Diagnostic tests  none; waiting to be scheduled for MRI     Patient Stated Goals  resume house repairs;  get back at work as nurse    Currently in Pain?  No/denies                      [facility] Adult PT Treatment/Exercise - 01/13/17 0001      Neck Exercises: Standing   Neck Retraction  15 reps;Other (comment) standing head on ball and lift    Other Standing Exercises  UE ranger for left shoulder flexion and abduction    Other Standing Exercises  bil. shoulder extension with blue band 10x      Neck Exercises: Seated   Other Seated Exercise  shoulder horizontal abduction with red band 10x; diagonals with red band and cervical retraction 10x bil.  tactile cues to keep head retracted      Manual Therapy   Manual Therapy  Other (comment)    Other Manual  Therapy  neural tension stretch on the left UE             PT Education - 01/13/17 1526    Education provided  Yes    Education Details  neural tension stretch; scapula retraction    Person(s) Educated  Patient    Methods  Explanation;Demonstration;Verbal cues;Handout    Comprehension  Returned demonstration;Verbalized understanding       PT Short Term Goals - 01/08/17 1542      PT SHORT TERM GOAL #3   Title  The patient will have improved cervical ROM with extension to 30 degrees and left sidebending to 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    Time  4    Period  Weeks    Status  Achieved        PT Long Term Goals - 01/13/17 1530      PT LONG TERM GOAL #1   Title  The patient will be independent with self management and HEP for further improvement in pain, strength and return to function    Baseline  still learning    Time  8  Period  Weeks    Status  On-going      PT LONG TERM GOAL #2   Title  The patient will report a decrease in central and peripheral UE pain and symptoms by 60%    Baseline  50% better    Time  8    Period  Weeks    Status  On-going      PT LONG TERM GOAL #3   Title  The patient will have improved left UE strength to 4+/5  and grip to 65# needed for playing the guitar    Time  8    Period  Weeks    Status  On-going      PT LONG TERM GOAL #4   Title  Cervical ROM WFLs in all planes and UE ROM WFLs with minimal pain needed for work duties    Time  8    Period  Weeks    Status  On-going            Plan - 01/13/17 1527    Clinical Impression Statement  Patient is able to increase his resistance with stronger theraband.  Patient is able to perform interscapular exercises in sitting without pain.  Patient was coughing and having sharp pain in neck due to quick cervical movement.  Patient notices he is able to do more with left hand due to increased strength. Patient will benefit from skilled therapy to reduce pain and improve  function.     Rehab Potential  Good    PT Frequency  2x / week    PT Duration  8 weeks    PT Treatment/Interventions  ADLs/Self Care Home Management;Electrical Stimulation;Cryotherapy;Ultrasound;Traction;Moist Heat;Patient/family education;Neuromuscular re-education;Therapeutic exercise;Therapeutic activities;Manual techniques;Taping;Dry needling    PT Next Visit Plan  continue with scapula strength and see how injection went on 01/19/2017    PT Home Exercise Plan  progress as needed    Consulted and Agree with Plan of Care  Patient       Patient will benefit from skilled therapeutic intervention in order to improve the following deficits and impairments:  Pain, Decreased range of motion, Decreased strength, Impaired UE functional use, Postural dysfunction, Decreased activity tolerance  Visit Diagnosis: Cervicalgia  Muscle weakness (generalized)  Radiculopathy, cervicothoracic region     Problem List Patient Active Problem List   Diagnosis Date Noted  . Hyperlipidemia 11/21/2016  . OSA (obstructive sleep apnea) 06/03/2016  . Hypertension, essential, benign 06/03/2016    Earlie Counts, PT 01/13/17 3:32 PM    [facility] [address], [place], [place], [address] Phone: [phone]   Fax:  [phone]  Name: Erik Rubio. MRN: [id number] Date of Birth: May 29, 1965

## 2017-01-19 ENCOUNTER — Encounter: Payer: 59 | Admitting: Physical Therapy

## 2017-01-19 ENCOUNTER — Ambulatory Visit (INDEPENDENT_AMBULATORY_CARE_PROVIDER_SITE_OTHER): Payer: 59 | Admitting: Physical Medicine and Rehabilitation

## 2017-01-19 VITALS — BP 136/93 | HR 101 | Temp 99.1°F

## 2017-01-19 DIAGNOSIS — M609 Myositis, unspecified: Secondary | ICD-10-CM | POA: Diagnosis not present

## 2017-01-19 DIAGNOSIS — M5010 Cervical disc disorder with radiculopathy, unspecified cervical region: Secondary | ICD-10-CM | POA: Diagnosis not present

## 2017-01-19 DIAGNOSIS — M5412 Radiculopathy, cervical region: Secondary | ICD-10-CM | POA: Diagnosis not present

## 2017-01-19 NOTE — Progress Notes (Deleted)
80% relief with last injection, catches with certain positions, coughing hurts.+Driver-wife Marissa, -Bt's. -Dye allergy

## 2017-01-20 ENCOUNTER — Encounter (INDEPENDENT_AMBULATORY_CARE_PROVIDER_SITE_OTHER): Payer: Self-pay | Admitting: Physical Medicine and Rehabilitation

## 2017-01-20 NOTE — Progress Notes (Signed)
Erik Rubio. - 51 y.o. male MRN [id number]  Date of birth: 1966/01/15  Office Visit Note: Visit Date: 01/19/2017 PCP: Martinique, Betty G, MD Referred by: Martinique, Betty G, MD  Subjective: Chief Complaint  Patient presents with  . Neck - Pain, Follow-up   HPI: Erik Rubio is a pleasant 51 year old right-hand-dominant gentleman who we recently completed cervical epidural injection.  He reports being more than 80% better in terms of his pain.  He reports the only time he really notices it as much now is when he coughs.  He reports sometimes at night with certain movements he will get a sharp pain or pinching sensation and then it goes away.  Overall he is very pleased with the amount of relief that he has had.  He reports that the coughing started about the same time as the pain in the neck started but it was also at the time that he was started on an ACE inhibitor for high blood pressure.  He is Artie taking amlodipine and then his blood pressure was increased because of increasing pain or at least that is what he felt.  Once he was placed on the ACE inhibitor he did have a cough which is a common side effect.  Every time he coughs he does get a jolt of pain to some degree.  He reports that sneezing is not really an issue more than the coughing is.  He has had no new injury or trauma.  No other signs or symptoms just decent relief from the injection.  He does have significant issues of the cervical spine but on exam and clinical history is doing fairly well.  He has some level of generalized anxiety.  He does clearly have myofascial pain as well and has been through physical therapy.    Review of Systems  Constitutional: Negative for chills, fever, malaise/fatigue and weight loss.  HENT: Negative for hearing loss and sinus pain.   Eyes: Negative for blurred vision, double vision and photophobia.  Respiratory: Positive for cough. Negative for shortness of breath.   Cardiovascular:  Negative for chest pain, palpitations and leg swelling.  Gastrointestinal: Negative for abdominal pain, nausea and vomiting.  Genitourinary: Positive for flank pain.  Musculoskeletal: Positive for neck pain. Negative for myalgias.  Skin: Negative for itching and rash.  Neurological: Negative for tremors, focal weakness and weakness.  Endo/Heme/Allergies: Negative.   Psychiatric/Behavioral: Negative for depression.  All other systems reviewed and are negative.  Otherwise per HPI.  Assessment & Plan: Visit Diagnoses:  1. Cervical radiculopathy   2. Cervical disc disorder with radiculopathy   3. Myofascitis     Plan: Findings:  Chronic off-and-on neck pain with more significant left radicular pain likely from a C7-T1 leftward disc extrusion.  He does have multilevel disc herniation and cord flattening but no severe central stenosis.  He is now status post C7-T1 interlaminar epidural steroid injection with over 80% relief of his symptoms.  He came in today because he was still having some minor flashes of pinching type pain with coughing but predominately.  I think the biggest issue is to talk to his primary care physician about probably switching the ACE inhibitor to see if that would reduce his coughing symptoms.  He likely may do okay once the coughing has abated.  Do not think he needs a second injection at this point as he is doing fairly well.  We will monitor the situation and he knows that if the symptoms  start to become more evident that I would rather repeat the injection one time before it got severe.  Obviously if it severely worsens he is to call us and we can try to do another injection.  We have talked about the natural history of disc herniations and extrusions even though he has had multiple levels.  I do think these will either regress or just not be a very painful situation forever.  He does have some myofascial pain and trigger points.  I showed his wife how to treat the trigger  points manually.  It may be something that he could regroup with physical therapy to look at dry needling.  As for right now it is going to watch him and see how he does.  He also will discontinue the gabapentin since he is doing much better.  He did report some sexual dysfunction with the gabapentin at night.    Meds & Orders: No orders of the defined types were placed in this encounter.  No orders of the defined types were placed in this encounter.   Follow-up: Return if symptoms worsen or fail to improve.   Procedures: No procedures performed  No notes on file   Clinical History: MRI CERVICAL SPINE WITHOUT CONTRAST  TECHNIQUE: Multiplanar, multisequence MR imaging of the cervical spine was performed. No intravenous contrast was administered.  COMPARISON:  None.  FINDINGS: Alignment: Straightening of the normal cervical lordosis. No subluxation.  Vertebrae: Abnormal C6 vertebral body, mildly heterogeneous T2 and STIR hyperintense, T1 hypointense. This is not clearly a posttraumatic abnormality. There is no loss of height or wedging. Cortical margins are grossly intact. A primary bone lesion or metastatic deposit could have this appearance. It is possible this represents an atypical hemangioma, with a striated appearance on axial imaging, although there is a paucity of fat. Prominent esophagus anterior to C6 simulates prevertebral soft tissue swelling.  Cord: Cord flattening at multiple levels, due to disc disease, exacerbated by short pedicles, at C3-4, C4-5, C5-6, C6-7, and C7-T1.  Posterior Fossa, vertebral arteries, paraspinal tissues: No tonsillar herniation. Flow voids are maintained in the vertebral arteries. No neck masses. Query interspinous ligament edema and C7-T1 see series 4 image 8.  Disc levels:  C2-3: Central and leftward extrusion. Slight cord flattening. LEFT C3 foraminal narrowing.  C3-4: Broad-based central protrusion. Short pedicles.  Stenosis with cord flattening. BILATERAL C4 foraminal narrowing.  C4-5: Broad-based central protrusion. Cord flattening. Short pedicles. No C5 foraminal narrowing.  C5-6: Central protrusion. Cord flattening. Short pedicles. No C6 foraminal narrowing.  C6-7: Central and rightward protrusion. RIGHT-sided cord flattening short pedicles. RIGHT C7 foraminal narrowing.  C7-T1: Central and leftward extrusion extends into the foramen. Slight cord flattening. Significant LEFT C8 nerve root compression.  IMPRESSION: The dominant LEFT-sided abnormality is at C7-T1 where a central and leftward disc extrusion extends into the foramen. In the slight cord flattening with significant LEFT C8 nerve root compression.  Multilevel disc disease elsewhere, in conjunction with short pedicles, results in varying degrees of spinal stenosis and cord flattening, without abnormal cord signal. This is most pronounced at C4-C5.  Abnormal appearance to the C6 vertebral body is indeterminate. A metastatic deposit could have this appearance, but there is no history of cancer. This could represent a primary bone lesion. Atypical hemangioma is less favored but not excluded. The appearance is not clearly posttraumatic, and the patient does not have a significant history of trauma. Consider further evaluation with cervical spine CT scanning without contrast. Post infusion MRI  due imaging could also be performed, using fat saturation techniques.  These results will be called to the ordering clinician or representative by the Radiologist Assistant, and communication documented in the PACS or zVision Dashboard.   Electronically Signed   By: Staci Righter M.D.   On: 12/23/2016 16:47  He reports that  has never smoked. he has never used smokeless tobacco.  Recent Labs    11/21/16 0735  HGBA1C 5.6    Objective:  VS:  HT:    WT:   BMI:     BP:(!) 136/93  HR:(!) 101bpm  TEMP:99.1 F (37.3 C)( )   RESP:97 % Physical Exam  Constitutional: He is oriented to person, place, and time. He appears well-developed and well-nourished. No distress.  HENT:  Head: Normocephalic and atraumatic.  Eyes: Conjunctivae are normal. Pupils are equal, round, and reactive to light.  Neck: Neck supple. No JVD present. No tracheal deviation present.  Cardiovascular: Regular rhythm and intact distal pulses.  Pulmonary/Chest: Effort normal. No respiratory distress.  Musculoskeletal:  Cervical range of motion is limited with left rotation at end range extension.  He has a negative Spurling's test.  He has focal trigger points in the supraspinatus as well as levator scapula and trapezius and rhomboid.  These are not super active but are present and do reproduce some pain.  He has good shoulder range of motion with some impingement signs mildly.  Good strength in the hands distally.  Lymphadenopathy:    He has no cervical adenopathy.  Neurological: He is alert and oriented to person, place, and time.  Skin: Skin is warm and dry. No rash noted. No erythema.  Psychiatric: He has a normal mood and affect.  Nursing note and vitals reviewed.   Ortho Exam Imaging: No results found.  Past Medical/Family/Surgical/Social History: Medications & Allergies reviewed per EMR Patient Active Problem List   Diagnosis Date Noted  . Hyperlipidemia 11/21/2016  . OSA (obstructive sleep apnea) 06/03/2016  . Hypertension, essential, benign 06/03/2016   Past Medical History:  Diagnosis Date  . Arthritis    Knee OA  . Hypertension    Family History  Problem Relation Age of Onset  . Stroke Mother        aneurysm  . Hypertension Mother   . Hypertension Father    Past Surgical History:  Procedure Laterality Date  . CHOLECYSTECTOMY  2003   Social History   Occupational History  . Not on file  Tobacco Use  . Smoking status: Never Smoker  . Smokeless tobacco: Never Used  Substance and Sexual Activity  . Alcohol  use: Yes    Comment: Occasional  . Drug use: No  . Sexual activity: Not on file

## 2017-01-21 ENCOUNTER — Encounter: Payer: Self-pay | Admitting: Physical Therapy

## 2017-01-21 ENCOUNTER — Ambulatory Visit: Payer: 59 | Admitting: Physical Therapy

## 2017-01-21 DIAGNOSIS — M542 Cervicalgia: Secondary | ICD-10-CM | POA: Diagnosis not present

## 2017-01-21 DIAGNOSIS — M5413 Radiculopathy, cervicothoracic region: Secondary | ICD-10-CM | POA: Diagnosis not present

## 2017-01-21 DIAGNOSIS — M6281 Muscle weakness (generalized): Secondary | ICD-10-CM | POA: Diagnosis not present

## 2017-01-21 NOTE — Therapy (Signed)
[facility] [address] [place], [place], [address] Phone: [phone]   Fax:  [phone]  Physical Therapy Treatment  Patient Details  Name: Erik Rubio. MRN: [id number] Date of Birth: 09-22-1965 Referring Provider: Dr. Betty Martinique   Encounter Date: 01/21/2017  PT End of Session - 01/21/17 1523    Visit Number  10    Date for PT Re-Evaluation  02/05/17    Authorization Type  [facility] [employer]    PT Start Time  3007    PT Stop Time  1523    PT Time Calculation (min)  38 min    Activity Tolerance  Patient tolerated treatment well;No increased pain    Behavior During Therapy  WFL for tasks assessed/performed       Past Medical History:  Diagnosis Date  . Arthritis    Knee OA  . Hypertension     Past Surgical History:  Procedure Laterality Date  . CHOLECYSTECTOMY  2003    There were no vitals filed for this visit.  Subjective Assessment - 01/21/17 1452    Subjective  I am feeling much better.  I saw the orthopedist and he feels I do not need the second shot. I ge an occasional ache in the left elbow.     Limitations  House hold activities    Diagnostic tests  none; waiting to be scheduled for MRI     Patient Stated Goals  resume house repairs;  get back at work as nurse    Currently in Pain?  No/denies                      [facility] Adult PT Treatment/Exercise - 01/21/17 0001      Neck Exercises: Machines for Strengthening   UBE (Upper Arm Bike)  2 min forward/2 min backward level 1      Neck Exercises: Standing   Upper Extremity D2  10 reps;Extension;Flexion;Weights    UE D2 Weights (lbs)  20# power tower also did the raking movement with weight    Other Standing Exercises  tricep with hip flexion 3# 3x10; wall push-ups 20x    Other Standing Exercises  roll red physioball up the wall; holding the body blade moveing quickly with elbow at side then move up and down.       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    Time  4    Period  Weeks    Status  Achieved        PT Long Term Goals - 01/13/17 1530      PT LONG TERM GOAL #1   Title  The patient will be independent with self management and HEP for further improvement in pain, strength and return  to function    Baseline  still learning    Time  8    Period  Weeks    Status  On-going      PT LONG TERM GOAL #2   Title  The patient will report a decrease in central and peripheral UE pain and symptoms by 60%    Baseline  50% better    Time  8    Period  Weeks    Status  On-going      PT LONG TERM GOAL #3   Title  The patient will have improved left UE strength to 4+/5  and grip to 65# needed for playing the guitar    Time  8    Period  Weeks    Status  On-going      PT LONG TERM GOAL #4   Title  Cervical ROM WFLs in all planes and UE ROM WFLs with minimal pain needed for work duties    Time  8    Period  Weeks    Status  On-going            Plan - 01/21/17 1523    Clinical Impression Statement  Patient came into therapy with no pain. On occasion he will have pain in left elbow. Since patient is doing well he did not need the second set of injections at this time.  Patient used weights with his exercises for the first time without increase in pain.  Patient will benefit from skilled therapy to reduce pain and improve function.      Rehab Potential  Good    PT Frequency  2x / week    PT Duration  8 weeks    PT Treatment/Interventions  ADLs/Self Care Home Management;Electrical Stimulation;Cryotherapy;Ultrasound;Traction;Moist Heat;Patient/family education;Neuromuscular re-education;Therapeutic exercise;Therapeutic activities;Manual techniques;Taping;Dry needling    PT Next Visit Plan  go over HEP with weights if patient felt fine after workout, see how patient did with raking, if patient is doing fine possible discharge    PT Home Exercise Plan  progress as needed    Consulted and Agree with Plan of Care  Patient       Patient will benefit from skilled therapeutic intervention in order to improve the following deficits and impairments:  Pain, Decreased range of motion, Decreased strength, Impaired UE functional use, Postural dysfunction, Decreased activity tolerance  Visit Diagnosis: Cervicalgia  Muscle weakness (generalized)  Radiculopathy, cervicothoracic region     Problem List Patient Active Problem List   Diagnosis Date Noted  . Hyperlipidemia 11/21/2016  . OSA (obstructive sleep apnea) 06/03/2016  . Hypertension, essential, benign 06/03/2016    Earlie Counts, PT 01/21/17 3:27 PM   [facility] [address] [place], [place], [address] Phone: [phone]   Fax:  [phone]  Name: Erik Rubio. MRN: [id number] Date of Birth: September 14, 1965

## 2017-01-27 ENCOUNTER — Ambulatory Visit: Payer: 59 | Attending: Family Medicine | Admitting: Physical Therapy

## 2017-01-27 DIAGNOSIS — M5413 Radiculopathy, cervicothoracic region: Secondary | ICD-10-CM | POA: Diagnosis not present

## 2017-01-27 DIAGNOSIS — M542 Cervicalgia: Secondary | ICD-10-CM | POA: Diagnosis not present

## 2017-01-27 DIAGNOSIS — M6281 Muscle weakness (generalized): Secondary | ICD-10-CM | POA: Diagnosis not present

## 2017-01-27 NOTE — Therapy (Signed)
[facility] [address] [place], [place], [address] Phone: [phone]   Fax:  [phone]  Physical Therapy Treatment  Patient Details  Name: Erik Rubio. MRN: [id number] Date of Birth: 1965-03-30 Referring Provider: Dr. Betty Martinique   Encounter Date: 01/27/2017  PT End of Session - 01/27/17 1513    Visit Number  11    Date for PT Re-Evaluation  02/05/17    Authorization Type  [facility]    PT Start Time  7741    PT Stop Time  1523    PT Time Calculation (min)  38 min    Activity Tolerance  Patient tolerated treatment well;No increased pain    Behavior During Therapy  WFL for tasks assessed/performed       Past Medical History:  Diagnosis Date  . Arthritis    Knee OA  . Hypertension     Past Surgical History:  Procedure Laterality Date  . CHOLECYSTECTOMY  2003    There were no vitals filed for this visit.  Subjective Assessment - 01/27/17 1446    Subjective  Pt reports that thigns are going well. He did rake his leaves after his last session which resulted in just minor soreness in the forearm. He has no pain currently. He feels 90% improved overall.     Limitations  House hold activities    Diagnostic tests  none; waiting to be scheduled for MRI     Patient Stated Goals  resume house repairs;  get back at work as nurse    Currently in Pain?  No/denies                      [facility] Adult PT Treatment/Exercise - 01/27/17 0001      Neck Exercises: Standing   Upper Extremity D2  10 reps;Flexion;Weights    UE D2 Weights (lbs)  20# power tower    Other Standing Exercises  tricep with hip flexion 3# 3x15; shoulder extension with hip flexion 3x10 reps     Other Standing Exercises  wall pushup x15 reps, 2nd set with UE on red physioball       Neck Exercises: Seated   Shoulder Flexion  20 reps;Both keeping chin retracted    Shoulder Flexion Weights (lbs)  3#    Shoulder  ABduction  20 reps;Both keeping chin retracted    Shoulder Abduction Weights (lbs)  3#     Other Seated Exercise  Shoulder D2 flexion with green TB x15 reps each; seated chops with green TB x10 reps with neck retraction, last 5 reps with cervical/eyes following UEs    Other Seated Exercise  press into weights on mat and depress scapulas, 3 sec hold x10 reps              PT Education - 01/27/17 1512    Education provided  Yes    Education Details  discussed possible d/c next session; importance of using core as a whole to avoid compensatory patterns    Person(s) Educated  Patient    Methods  Explanation    Comprehension  Verbalized understanding       PT Short Term Goals - 01/08/17 1542      PT SHORT TERM GOAL #3   Title  The patient will have improved cervical ROM with extension to 30 degrees and left sidebending to 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    Time  4  Period  Weeks    Status  Achieved        PT Long Term Goals - 01/27/17 1450      PT LONG TERM GOAL #1   Title  The patient will be independent with self management and HEP for further improvement in pain, strength and return to function    Baseline  still learning    Time  8    Period  Weeks    Status  On-going      PT LONG TERM GOAL #2   Title  The patient will report a decrease in central and peripheral UE pain and symptoms by 60%    Baseline  90% improved     Time  8    Period  Weeks    Status  Achieved      PT LONG TERM GOAL #3   Title  The patient will have improved left UE strength to 4+/5  and grip to 65# needed for playing the guitar    Time  8    Period  Weeks    Status  On-going      PT LONG TERM GOAL #4   Title  Cervical ROM WFLs in all planes and UE ROM WFLs with minimal pain needed for work duties    Time  8    Period  Weeks    Status  On-going            Plan - 01/27/17 1522    Clinical Impression Statement  Pt arrived today reporting little difficulty following  raking his yard several days ago. Session continued with therex progression to improve both UE strength and core control. Pt was able to complete progressions in all areas without significant difficulty. Overall he feels he is about 90% improved and therapist discussed likely d/c next session due to pt's steady progress. Pt was agreeable to this and reported no symptoms or pain throughout today's session.     Rehab Potential  Good    PT Frequency  2x / week    PT Duration  8 weeks    PT Treatment/Interventions  ADLs/Self Care Home Management;Electrical Stimulation;Cryotherapy;Ultrasound;Traction;Moist Heat;Patient/family education;Neuromuscular re-education;Therapeutic exercise;Therapeutic activities;Manual techniques;Taping;Dry needling    PT Next Visit Plan  discharge as long as no exacerbation of symptoms is noted     PT Home Exercise Plan  progress as needed    Consulted and Agree with Plan of Care  Patient       Patient will benefit from skilled therapeutic intervention in order to improve the following deficits and impairments:  Pain, Decreased range of motion, Decreased strength, Impaired UE functional use, Postural dysfunction, Decreased activity tolerance  Visit Diagnosis: Cervicalgia  Muscle weakness (generalized)  Radiculopathy, cervicothoracic region     Problem List Patient Active Problem List   Diagnosis Date Noted  . Hyperlipidemia 11/21/2016  . OSA (obstructive sleep apnea) 06/03/2016  . Hypertension, essential, benign 06/03/2016   3:25 PM,01/27/17 Elly Modena PT, DPT [place] [address] [place], [place], [address] Phone: [phone]   Fax:  [phone]  Name: Erik Rubio. MRN: [id number] Date of Birth: 27-Jan-1966

## 2017-01-29 ENCOUNTER — Ambulatory Visit: Payer: 59 | Admitting: Physical Therapy

## 2017-01-29 ENCOUNTER — Encounter: Payer: Self-pay | Admitting: Physical Therapy

## 2017-01-29 DIAGNOSIS — M5413 Radiculopathy, cervicothoracic region: Secondary | ICD-10-CM | POA: Diagnosis not present

## 2017-01-29 DIAGNOSIS — M6281 Muscle weakness (generalized): Secondary | ICD-10-CM | POA: Diagnosis not present

## 2017-01-29 DIAGNOSIS — M542 Cervicalgia: Secondary | ICD-10-CM | POA: Diagnosis not present

## 2017-01-29 NOTE — Therapy (Signed)
[facility] [address] [place], [place], [address] Phone: [phone]   Fax:  [phone]  Physical Therapy Treatment  Patient Details  Name: Erik Rubio. MRN: [id number] Date of Birth: [birth year] Referring Provider: Dr. Betty Martinique   Encounter Date: 01/29/2017  PT End of Session - 01/29/17 1452    Visit Number  12    Date for PT Re-Evaluation  02/05/17    Authorization Type  [facility]    PT Start Time  1324    PT Stop Time  1525    PT Time Calculation (min)  40 min    Activity Tolerance  Patient tolerated treatment well;No increased pain    Behavior During Therapy  WFL for tasks assessed/performed       Past Medical History:  Diagnosis Date  . Arthritis    Knee OA  . Hypertension     Past Surgical History:  Procedure Laterality Date  . CHOLECYSTECTOMY  2003    There were no vitals filed for this visit.  Subjective Assessment - 01/29/17 1452    Subjective  I was able to rake and do all activities without difficulty.     Patient Stated Goals  resume house repairs;  get back at work as nurse    Currently in Pain?  No/denies         [facility] PT Assessment - 01/29/17 0001      Assessment   Medical Diagnosis  upper back pain left side    Referring Provider  Dr. Betty Martinique    Onset Date/Surgical Date  12/01/16    Hand Dominance  Right    Next MD Visit  March    Prior Therapy  in [place] 5 years ago      Precautions   Precautions  None      Restrictions   Weight Bearing Restrictions  No      Home Environment   Living Environment  Private residence      Prior Function   Level of Independence  Independent      Cognition   Overall Cognitive Status  Within Functional Limits for tasks assessed      Observation/Other Assessments   Focus on Therapeutic Outcomes (FOTO)   1% limitation      Posture/Postural Control   Posture/Postural Control  No significant limitations       AROM   Cervical Flexion  55    Cervical Extension  55 no pinch    Cervical - Right Side Bend  40    Cervical - Left Side Bend  40    Cervical - Right Rotation  85    Cervical - Left Rotation  85      Strength   Left Shoulder Flexion  5/5    Left Shoulder Extension  5/5    Left Shoulder ABduction  5/5    Left Shoulder Internal Rotation  5/5    Left Shoulder External Rotation  5/5    Left Wrist Extension  5/5    Left Hand Grip (lbs)  72      Distraction Test   Findngs  Negative      other    Findings  Negative                  [facility] Adult PT Treatment/Exercise - 01/29/17 0001      Neck Exercises: Machines for Strengthening   UBE (Upper Arm Bike)  2 min forward/2 min backward  level 1      Neck Exercises: Seated   Other Seated Exercise  elbow flexion 10x, elbow extension 10x, horizontal shoulder abduction black band, bil. shoulder ER with black band 10x, bil. scapula retraction black band 10x, bil. shoulder extension black band 10x,              PT Education - 01/29/17 1519    Education provided  Yes    Education Details  reviewed past HEP and patient is able to perform correctly.  Patient is now using the black band.     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    Time  4    Period  Weeks    Status  Achieved        PT Long Term Goals - 01/29/17 1524      PT LONG TERM GOAL #1   Title  The patient will be independent with self management and HEP for further improvement in pain, strength and return to function    Time  8    Period  Weeks    Status  Achieved      PT LONG TERM GOAL #2   Title  The patient will report a decrease in central  and peripheral UE pain and symptoms by 60%    Period  Weeks    Status  Achieved      PT LONG TERM GOAL #3   Title  The patient will have improved left UE strength to 4+/5  and grip to 65# needed for playing the guitar    Time  8    Period  Weeks    Status  Achieved      PT LONG TERM GOAL #4   Title  Cervical ROM WFLs in all planes and UE ROM WFLs with minimal pain needed for work duties    Time  8    Period  Weeks    Status  Achieved      PT LONG TERM GOAL #5   Title  FOTO functional outcome score improved from 53% limitation to 33% limitation indicating improved function with less pain.      Time  8    Period  Weeks    Status  Achieved            Plan - 01/29/17 1453    Clinical Impression Statement  Patient has met his goals.  Patient has full strength of left UE and grip strength has increased to 72#.  Patient has full cervical ROM and no radicular signs with movement.  Patient has not numbness or tingling in left UE.  Patient reports some weakness in left finger dexterity.  Patient is independent with HEP. Patient is ready for discharge.     Rehab Potential  Good    PT Treatment/Interventions  ADLs/Self Care Home Management;Electrical Stimulation;Cryotherapy;Ultrasound;Traction;Moist Heat;Patient/family education;Neuromuscular re-education;Therapeutic exercise;Therapeutic activities;Manual techniques;Taping;Dry needling    PT Next Visit Plan  discharge to HEP this visit    PT Home Exercise Plan  Current HEP    Consulted and Agree with Plan of Care  Patient       Patient  will benefit from skilled therapeutic intervention in order to improve the following deficits and impairments:  Pain, Decreased range of motion, Decreased strength, Impaired UE functional use, Postural dysfunction, Decreased activity tolerance  Visit Diagnosis: Cervicalgia  Muscle weakness (generalized)  Radiculopathy, cervicothoracic region     Problem List Patient Active Problem List    Diagnosis Date Noted  . Hyperlipidemia 11/21/2016  . OSA (obstructive sleep apnea) 06/03/2016  . Hypertension, essential, benign 06/03/2016    Earlie Counts, PT 01/29/17 3:26 PM   [facility] [address] [place], [place], [address] Phone: [phone]   Fax:  [phone]  Name: Erik Rubio. MRN: [id number] Date of Birth: September 04, 1965  PHYSICAL THERAPY DISCHARGE SUMMARY  Visits from Start of Care: 12  Current functional level related to goals / functional outcomes: See above.    Remaining deficits: See above   Education / Equipment: HEP Plan: Patient agrees to discharge.  Patient goals were met. Patient is being discharged due to meeting the stated rehab goals.  Thank you for the referral. Earlie Counts, PT 01/29/17 3:26 PM  ?????

## 2017-02-03 ENCOUNTER — Encounter: Payer: 59 | Admitting: Physical Therapy

## 2017-03-10 MED FILL — BENAZEPRIL HCL 10 MG TABLET: 10 | 30 days supply | Qty: 30 | Fill #2

## 2017-03-10 MED FILL — AMLODIPINE BESYLATE 10 MG T: 10 | 90 days supply | Qty: 90 | Fill #1

## 2017-05-22 ENCOUNTER — Ambulatory Visit: Payer: 59 | Admitting: Family Medicine

## 2017-05-24 NOTE — Progress Notes (Signed)
Erik Rubio. is a 52 y.o.male, who is here today to follow on HTN.  Currently he is on Benazepril 10 mg daily and Amlodipine 10 mg daily. He is taking medications as instructed, causing mild ED.  He has not noted unusual headache, visual changes, exertional chest pain, dyspnea,  focal weakness, or edema.  Last eye exam: 2 year   Home BP readings: Occasionally DBP 90's  Lab Results  Component Value Date   CREATININE 1.09 12/15/2016   BUN 18 12/15/2016   NA 135 12/15/2016   K 3.8 12/15/2016   CL 98 12/15/2016   CO2 27 12/15/2016      Review of Systems  Constitutional: Negative for activity change, appetite change, fatigue, fever and unexpected weight change.  HENT: Positive for congestion (seasonal ) and rhinorrhea. Negative for nosebleeds, sore throat and trouble swallowing.   Eyes: Negative for redness and visual disturbance.  Respiratory: Negative for apnea, cough, shortness of breath and wheezing.   Cardiovascular: Negative for chest pain, palpitations and leg swelling.  Gastrointestinal: Negative for abdominal pain, nausea and vomiting.  Endocrine: Negative for cold intolerance and heat intolerance.  Genitourinary: Negative for decreased urine volume, dysuria and hematuria.  Musculoskeletal: Negative for gait problem and myalgias.  Skin: Negative for rash.  Neurological: Negative for syncope, weakness and headaches.     Current Outpatient Medications on File Prior to Visit  Medication Sig Dispense Refill  . amLODipine (NORVASC) 10 MG tablet Take 1 tablet (10 mg total) by mouth daily. 90 tablet 3   No current facility-administered medications on file prior to visit.      Past Medical History:  Diagnosis Date  . Arthritis    Knee OA  . Hypertension     No Known Allergies  Social History   Socioeconomic History  . Marital status: Married    Spouse name: Not on file  . Number of children: Not on file  . Years of education: Not on  file  . Highest education level: Not on file  Occupational History  . Not on file  Social Needs  . Financial resource strain: Not on file  . Food insecurity:    Worry: Not on file    Inability: Not on file  . Transportation needs:    Medical: Not on file    Non-medical: Not on file  Tobacco Use  . Smoking status: Never Smoker  . Smokeless tobacco: Never Used  Substance and Sexual Activity  . Alcohol use: Yes    Comment: Occasional  . Drug use: No  . Sexual activity: Not on file  Lifestyle  . Physical activity:    Days per week: Not on file    Minutes per session: Not on file  . Stress: Not on file  Relationships  . Social connections:    Talks on phone: Not on file    Gets together: Not on file    Attends religious service: Not on file    Active member of club or organization: Not on file    Attends meetings of clubs or organizations: Not on file    Relationship status: Not on file  Other Topics Concern  . Not on file  Social History Narrative  . Not on file    Vitals:   05/25/17 1603  BP: 132/78  Pulse: 85  Resp: 12  Temp: 99.1 F (37.3 C)  SpO2: 97%   Body mass index is 29.27 kg/m.   Physical Exam  Nursing  note and vitals reviewed. Constitutional: He is oriented to person, place, and time. He appears well-developed. No distress.  HENT:  Head: Normocephalic and atraumatic.  Mouth/Throat: Oropharynx is clear and moist and mucous membranes are normal.  Eyes: Pupils are equal, round, and reactive to light. Conjunctivae are normal.  Cardiovascular: Normal rate and regular rhythm.  No murmur heard. Pulses:      Dorsalis pedis pulses are 2+ on the right side, and 2+ on the left side.  Respiratory: Effort normal and breath sounds normal. No respiratory distress.  GI: Soft. He exhibits no mass. There is no hepatomegaly. There is no tenderness.  Musculoskeletal: He exhibits no edema.  Lymphadenopathy:    He has no cervical adenopathy.  Neurological: He is  alert and oriented to person, place, and time. He has normal strength. Gait normal.  Skin: Skin is warm. No rash noted. No erythema.  Psychiatric: He has a normal mood and affect. Cognition and memory are normal.  Well groomed, good eye contact.    ASSESSMENT AND PLAN:   Erik Rubio was seen today for follow-up.  Diagnoses and all orders for this visit:  Lab Results  Component Value Date   CREATININE 1.14 05/25/2017   BUN 18 05/25/2017   NA 139 05/25/2017   K 3.6 05/25/2017   CL 103 05/25/2017   CO2 28 05/25/2017    Hypertension, essential, benign  Adequately controlled here today but reporting some DBP's in the low/mid 90's. He does not want to change medications dose for now. DASH-low salt diet recommended. Eye exam recommended annually. F/U in 6 months, before if needed.   -     Basic metabolic panel -     benazepril (LOTENSIN) 10 MG tablet; Take 1 tablet (10 mg total) by mouth daily.    -Erik Rubio. was advised to return sooner than planned today if new concerns arise.     Betty G. Martinique, MD  [facility]. [place] office.

## 2017-05-25 ENCOUNTER — Ambulatory Visit: Payer: 59 | Admitting: Family Medicine

## 2017-05-25 ENCOUNTER — Encounter: Payer: Self-pay | Admitting: Family Medicine

## 2017-05-25 VITALS — BP 132/78 | HR 85 | Temp 99.1°F | Resp 12 | Ht 70.0 in | Wt 204.0 lb

## 2017-05-25 DIAGNOSIS — I10 Essential (primary) hypertension: Secondary | ICD-10-CM

## 2017-05-25 NOTE — Patient Instructions (Signed)
A few things to remember from today's visit:   Hypertension, essential, benign - Plan: Basic metabolic panel   DASH Eating Plan DASH stands for "Dietary Approaches to Stop Hypertension." The DASH eating plan is a healthy eating plan that has been shown to reduce high blood pressure (hypertension). It may also reduce your risk for type 2 diabetes, heart disease, and stroke. The DASH eating plan may also help with weight loss. What are tips for following this plan? General guidelines  Avoid eating more than 2,300 mg (milligrams) of salt (sodium) a day. If you have hypertension, you may need to reduce your sodium intake to 1,500 mg a day.  Limit alcohol intake to no more than 1 drink a day for nonpregnant women and 2 drinks a day for men. One drink equals 12 oz of beer, 5 oz of wine, or 1 oz of hard liquor.  Work with your health care provider to maintain a healthy body weight or to lose weight. Ask what an ideal weight is for you.  Get at least 30 minutes of exercise that causes your heart to beat faster (aerobic exercise) most days of the week. Activities may include walking, swimming, or biking.  Work with your health care provider or diet and nutrition specialist (dietitian) to adjust your eating plan to your individual calorie needs. Reading food labels  Check food labels for the amount of sodium per serving. Choose foods with less than 5 percent of the Daily Value of sodium. Generally, foods with less than 300 mg of sodium per serving fit into this eating plan.  To find whole grains, look for the word "whole" as the first word in the ingredient list. Shopping  Buy products labeled as "low-sodium" or "no salt added."  Buy fresh foods. Avoid canned foods and premade or frozen meals. Cooking  Avoid adding salt when cooking. Use salt-free seasonings or herbs instead of table salt or sea salt. Check with your health care provider or pharmacist before using salt substitutes.  Do not  fry foods. Cook foods using healthy methods such as baking, boiling, grilling, and broiling instead.  Cook with heart-healthy oils, such as olive, canola, soybean, or sunflower oil. Meal planning   Eat a balanced diet that includes: ? 5 or more servings of fruits and vegetables each day. At each meal, try to fill half of your plate with fruits and vegetables. ? Up to 6-8 servings of whole grains each day. ? Less than 6 oz of lean meat, poultry, or fish each day. A 3-oz serving of meat is about the same size as a deck of cards. One egg equals 1 oz. ? 2 servings of low-fat dairy each day. ? A serving of nuts, seeds, or beans 5 times each week. ? Heart-healthy fats. Healthy fats called Omega-3 fatty acids are found in foods such as flaxseeds and coldwater fish, like sardines, salmon, and mackerel.  Limit how much you eat of the following: ? Canned or prepackaged foods. ? Food that is high in trans fat, such as fried foods. ? Food that is high in saturated fat, such as fatty meat. ? Sweets, desserts, sugary drinks, and other foods with added sugar. ? Full-fat dairy products.  Do not salt foods before eating.  Try to eat at least 2 vegetarian meals each week.  Eat more home-cooked food and less restaurant, buffet, and fast food.  When eating at a restaurant, ask that your food be prepared with less salt or no salt, if  possible. What foods are recommended? The items listed may not be a complete list. Talk with your dietitian about what dietary choices are best for you. Grains Whole-grain or whole-wheat bread. Whole-grain or whole-wheat pasta. Brown rice. [place]. Bulgur. Whole-grain and low-sodium cereals. Pita bread. Low-fat, low-sodium crackers. Whole-wheat flour tortillas. Vegetables Fresh or frozen vegetables (raw, steamed, roasted, or grilled). Low-sodium or reduced-sodium tomato and vegetable juice. Low-sodium or reduced-sodium tomato sauce and tomato paste. Low-sodium or  reduced-sodium canned vegetables. Fruits All fresh, dried, or frozen fruit. Canned fruit in natural juice (without added sugar). Meat and other protein foods Skinless chicken or [place]. Ground chicken or [place]. Pork with fat trimmed off. Fish and seafood. Egg whites. Dried beans, peas, or lentils. Unsalted nuts, nut butters, and seeds. Unsalted canned beans. Lean cuts of beef with fat trimmed off. Low-sodium, lean deli meat. Dairy Low-fat (1%) or fat-free (skim) milk. Fat-free, low-fat, or reduced-fat cheeses. Nonfat, low-sodium ricotta or cottage cheese. Low-fat or nonfat yogurt. Low-fat, low-sodium cheese. Fats and oils Soft margarine without trans fats. Vegetable oil. Low-fat, reduced-fat, or light mayonnaise and salad dressings (reduced-sodium). Canola, safflower, olive, soybean, and sunflower oils. Avocado. Seasoning and other foods Herbs. Spices. Seasoning mixes without salt. Unsalted popcorn and pretzels. Fat-free sweets. What foods are not recommended? The items listed may not be a complete list. Talk with your dietitian about what dietary choices are best for you. Grains Baked goods made with fat, such as croissants, muffins, or some breads. Dry pasta or rice meal packs. Vegetables Creamed or fried vegetables. Vegetables in a cheese sauce. Regular canned vegetables (not low-sodium or reduced-sodium). Regular canned tomato sauce and paste (not low-sodium or reduced-sodium). Regular tomato and vegetable juice (not low-sodium or reduced-sodium). Angie Fava. Olives. Fruits Canned fruit in a light or heavy syrup. Fried fruit. Fruit in cream or butter sauce. Meat and other protein foods Fatty cuts of meat. Ribs. Fried meat. Berniece Salines. Sausage. Bologna and other processed lunch meats. Salami. Fatback. Hotdogs. Bratwurst. Salted nuts and seeds. Canned beans with added salt. Canned or smoked fish. Whole eggs or egg yolks. Chicken or [place] with skin. Dairy Whole or 2% milk, cream, and half-and-half.  Whole or full-fat cream cheese. Whole-fat or sweetened yogurt. Full-fat cheese. Nondairy creamers. Whipped toppings. Processed cheese and cheese spreads. Fats and oils Butter. Stick margarine. Lard. Shortening. Ghee. Bacon fat. Tropical oils, such as coconut, palm kernel, or palm oil. Seasoning and other foods Salted popcorn and pretzels. Onion salt, garlic salt, seasoned salt, table salt, and sea salt. Worcestershire sauce. Tartar sauce. Barbecue sauce. Teriyaki sauce. Soy sauce, including reduced-sodium. Steak sauce. Canned and packaged gravies. Fish sauce. Oyster sauce. Cocktail sauce. Horseradish that you find on the shelf. Ketchup. Mustard. Meat flavorings and tenderizers. Bouillon cubes. Hot sauce and Tabasco sauce. Premade or packaged marinades. Premade or packaged taco seasonings. Relishes. Regular salad dressings. Where to find more information:  National Heart, Lung, and [place]: https://wilson-eaton.com/  American Heart Association: www.heart.org Summary  The DASH eating plan is a healthy eating plan that has been shown to reduce high blood pressure (hypertension). It may also reduce your risk for type 2 diabetes, heart disease, and stroke.  With the DASH eating plan, you should limit salt (sodium) intake to 2,300 mg a day. If you have hypertension, you may need to reduce your sodium intake to 1,500 mg a day.  When on the DASH eating plan, aim to eat more fresh fruits and vegetables, whole grains, lean proteins, low-fat dairy, and heart-healthy fats.  Work with your health care provider or diet and nutrition specialist (dietitian) to adjust your eating plan to your individual calorie needs. This information is not intended to replace advice given to you by your health care provider. Make sure you discuss any questions you have with your health care provider. Document Released: 01/30/2011 Document Revised: 02/04/2016 Document Reviewed: 02/04/2016 Elsevier Interactive Patient Education   [employer].  Please be sure medication list is accurate. If a new problem present, please set up appointment sooner than planned today.

## 2017-05-26 LAB — BASIC METABOLIC PANEL
BUN: 18 mg/dL (ref 6–23)
CALCIUM: 9.4 mg/dL (ref 8.4–10.5)
CHLORIDE: 103 meq/L (ref 96–112)
CO2: 28 meq/L (ref 19–32)
Creatinine, Ser: 1.14 mg/dL (ref 0.40–1.50)
GFR: 72.42 mL/min (ref >60.00)
Glucose, Bld: 94 mg/dL (ref 70–99)
Potassium: 3.6 mEq/L (ref 3.5–5.1)
SODIUM: 139 meq/L (ref 135–145)

## 2017-05-30 ENCOUNTER — Encounter: Payer: Self-pay | Admitting: Family Medicine

## 2017-05-30 MED ORDER — BENAZEPRIL HCL 10 MG PO TABS
10.0000 mg | ORAL_TABLET | Freq: Every day | ORAL | 2 refills | Status: DC
Start: 2017-05-30 — End: 2017-11-23

## 2017-06-12 MED FILL — AMLODIPINE BESYLATE 10 MG T: 10 | 90 days supply | Qty: 90 | Fill #2

## 2017-06-15 MED FILL — BENAZEPRIL HCL 10 MG TABLET: 10 | 90 days supply | Qty: 90 | Fill #0

## 2017-06-22 DIAGNOSIS — H524 Presbyopia: Secondary | ICD-10-CM | POA: Diagnosis not present

## 2017-09-29 MED FILL — AMLODIPINE BESYLATE 10 MG T: 10 | 90 days supply | Qty: 90 | Fill #3

## 2017-11-22 NOTE — Progress Notes (Signed)
HPI:  Mr. Shell Yandow. is a 52 y.o.male here today for his routine physical examination.  Last CPE: 11/21/16 He lives with his wife and 2 children.  Regular exercise 3 or more times per week: Walking about once per week, 5-6 miles.  Following a healthy diet: "High on carbs."   Chronic medical problems: HTN,HLD,OSA not on CPAP.  Hx of STD's: Denies.  Immunization History  Administered Date(s) Administered  . Influenza,inj,Quad PF,6+ Mos 11/23/2017    Last colon cancer screening: N/A Last prostate ca screening: N/A  -Denies high alcohol intake, tobacco use, or Hx of illicit drug use.  -Concerns and/or follow up today:    Hyperlipidemia:  Currently on non pharmacologic treatment.  Lab Results  Component Value Date   CHOL 187 11/21/2016   HDL 63.60 11/21/2016   LDLCALC 108 (H) 11/21/2016   TRIG 80.0 11/21/2016   CHOLHDL 3 11/21/2016   HTN on Benazepril 10 mg daily. Home BP's: 130's/90's.  He is not following a low salt diet.   Lab Results  Component Value Date   CREATININE 1.14 05/25/2017   BUN 18 05/25/2017   NA 139 05/25/2017   K 3.6 05/25/2017   CL 103 05/25/2017   CO2 28 05/25/2017   Denies severe/frequent headache, visual changes, chest pain, dyspnea, palpitation, claudication,or focal weakness. He had mild bilateral ankle edema few weeks ago, resolved.  Left ankle pain for a week that started after being under a car for a few hours fixing it. [place]uma in [place] Dx with knee OA. "Darkening" area on posterior aspect of ankle, he has not noted bruising or deformity. Improving. He would like to have uric acid checked today. No history of gout. Takes Motrin OTC prn.   Review of Systems  Constitutional: Negative for activity change, appetite change, fatigue and fever.  HENT: Negative for dental problem, nosebleeds, sore throat, trouble swallowing and voice change.   Eyes: Negative for redness and visual disturbance.    Respiratory: Negative for cough, shortness of breath and wheezing.   Cardiovascular: Negative for chest pain, palpitations and leg swelling.  Gastrointestinal: Negative for abdominal pain, blood in stool, nausea and vomiting.       No changes in bowel habits.  Endocrine: Negative for cold intolerance, heat intolerance, polydipsia, polyphagia and polyuria.  Genitourinary: Negative for decreased urine volume, dysuria, genital sores, hematuria and testicular pain.  Musculoskeletal: Positive for arthralgias. Negative for gait problem and myalgias.  Skin: Negative for color change and rash.  Allergic/Immunologic: Negative for environmental allergies.  Neurological: Negative for dizziness, syncope, weakness and headaches.  Hematological: Negative for adenopathy. Does not bruise/bleed easily.  Psychiatric/Behavioral: Negative for confusion and sleep disturbance. The patient is not nervous/anxious.   All other systems reviewed and are negative.    Current Outpatient Medications on File Prior to Visit  Medication Sig Dispense Refill  . amLODipine (NORVASC) 10 MG tablet Take 1 tablet (10 mg total) by mouth daily. (Patient not taking: Reported on 11/23/2017) 90 tablet 3   No current facility-administered medications on file prior to visit.      Past Medical History:  Diagnosis Date  . Arthritis    Knee OA  . Hypertension     Past Surgical History:  Procedure Laterality Date  . CHOLECYSTECTOMY  2003    No Known Allergies  Family History  Problem Relation Age of Onset  . Stroke Mother        aneurysm  . Hypertension Mother   .  Hypertension Father     Social History   Socioeconomic History  . Marital status: Married    Spouse name: Not on file  . Number of children: Not on file  . Years of education: Not on file  . Highest education level: Not on file  Occupational History  . Not on file  Social Needs  . Financial resource strain: Not on file  . Food insecurity:     Worry: Not on file    Inability: Not on file  . Transportation needs:    Medical: Not on file    Non-medical: Not on file  Tobacco Use  . Smoking status: Never Smoker  . Smokeless tobacco: Never Used  Substance and Sexual Activity  . Alcohol use: Yes    Comment: Occasional  . Drug use: No  . Sexual activity: Not on file  Lifestyle  . Physical activity:    Days per week: Not on file    Minutes per session: Not on file  . Stress: Not on file  Relationships  . Social connections:    Talks on phone: Not on file    Gets together: Not on file    Attends religious service: Not on file    Active member of club or organization: Not on file    Attends meetings of clubs or organizations: Not on file    Relationship status: Not on file  Other Topics Concern  . Not on file  Social History Narrative  . Not on file     Vitals:   11/23/17 0756  BP: (!) 150/100  Pulse: 83  Resp: 12  Temp: 98.7 F (37.1 C)  SpO2: 99%   Body mass index is 29.31 kg/m.   Wt Readings from Last 3 Encounters:  11/23/17 204 lb 4 oz (92.6 kg)  05/25/17 204 lb (92.5 kg)  12/22/16 196 lb 6 oz (89.1 kg)     Physical Exam  Nursing note and vitals reviewed. Constitutional: He is oriented to person, place, and time. He appears well-developed. No distress.  HENT:  Head: Atraumatic.  Right Ear: Tympanic membrane, external ear and ear canal normal.  Left Ear: Tympanic membrane, external ear and ear canal normal.  Mouth/Throat: Oropharynx is clear and moist and mucous membranes are normal.  Eyes: Pupils are equal, round, and reactive to light. Conjunctivae and EOM are normal.  Neck: Normal range of motion. No tracheal deviation present. No thyromegaly present.  Cardiovascular: Normal rate and regular rhythm.  No murmur heard. Pulses:      Dorsalis pedis pulses are 2+ on the right side, and 2+ on the left side.  Respiratory: Effort normal and breath sounds normal. No respiratory distress.  GI: Soft. He  exhibits no mass. There is no hepatomegaly. There is no tenderness.  Genitourinary:  Genitourinary Comments: Refused,no concerns.  Musculoskeletal: He exhibits no edema or tenderness.       Left ankle: Achilles tendon exhibits pain (Mild). Achilles tendon exhibits no defect and normal Thompson's test results.       Left foot: There is normal range of motion and no tenderness.  No major deformities appreciated and no signs of synovitis.  Lymphadenopathy:    He has no cervical adenopathy.       Right: No supraclavicular adenopathy present.       Left: No supraclavicular adenopathy present.  Neurological: He is alert and oriented to person, place, and time. He has normal strength. No cranial nerve deficit or sensory deficit. Coordination and gait  normal.  Reflex Scores:      Bicep reflexes are 2+ on the right side and 2+ on the left side.      Patellar reflexes are 2+ on the right side and 2+ on the left side. Skin: Skin is warm. No erythema.  Psychiatric: His mood appears anxious. Cognition and memory are normal.  Well-groomed, good eye contact.     ASSESSMENT AND PLAN:  Mr. Jahad was seen today for annual exam.  Orders Placed This Encounter  Procedures  . Flu Vaccine QUAD 36+ mos IM  . Comprehensive metabolic panel  . Lipid panel  . Hemoglobin A1c  . TSH  . Cologuard  . PSA(Must document that pt has been informed of limitations of PSA testing.)  . Uric acid    Lab Results  Component Value Date   LABURIC 9.0 (H) 11/23/2017   Lab Results  Component Value Date   TSH 0.76 11/23/2017   Lab Results  Component Value Date   HGBA1C 5.7 11/23/2017   Lab Results  Component Value Date   CREATININE 1.20 11/23/2017   BUN 17 11/23/2017   NA 138 11/23/2017   K 4.4 11/23/2017   CL 102 11/23/2017   CO2 28 11/23/2017   Lab Results  Component Value Date   ALT 45 11/23/2017   AST 27 11/23/2017   ALKPHOS 89 11/23/2017   BILITOT 0.6 11/23/2017   Lab Results  Component  Value Date   PSA 2.53 11/23/2017     Routine general medical examination at a health care facility We discussed the importance of regular physical activity and healthy diet for prevention of chronic illness and/or complications. Preventive guidelines reviewed. Vaccination up-to-date.  Influenza vaccine given today.  Next CPE in a year.  The 10-year ASCVD risk score Mikey Bussing DC Brooke Bonito., et al., 2013) is: 4.6%   Values used to calculate the score:     Age: 85 years     Sex: Male     Is Non-Hispanic African American: No     Diabetic: No     Tobacco smoker: No     Systolic Blood Pressure: 315 mmHg     Is BP treated: Yes     HDL Cholesterol: 48.1 mg/dL     Total Cholesterol: 187 mg/dL  Diabetes mellitus screening -     Comprehensive metabolic panel -     Hemoglobin A1c  Colon cancer screening -     Cologuard  Prostate cancer screening -     PSA(Must document that pt has been informed of limitations of PSA testing.)   Achilles tendinitis of left lower extremity Improving. I do not think imaging is needed today. Recommend left ankle range of motion exercises. Follow-up as needed.  -     Uric acid  Need for immunization against influenza -     Flu Vaccine QUAD 36+ mos IM   Hypertension, essential, benign BP rechecked: 130/80. Because reporting mildly elevated BPs at home, HCTZ 12.5 mg added and benazepril increased from 10 mg to 20 mg. She will continue monitoring BP at home, instructed to send me BP readings in 3 to 4 weeks. Low-salt and DASH diet recommended. Eye exam is current Follow-up in 6 months, before if needed.  Hyperlipidemia For now he will continue nonpharmacologic treatment. Further recommendations will be given according to lipid panel results as well as 10 years CVD risk score.   Return in 6 months (on 05/24/2018) for HTN.    Betty G. Martinique, MD  [facility]. [place] office.

## 2017-11-23 ENCOUNTER — Encounter: Payer: Self-pay | Admitting: Family Medicine

## 2017-11-23 ENCOUNTER — Ambulatory Visit (INDEPENDENT_AMBULATORY_CARE_PROVIDER_SITE_OTHER): Payer: 59 | Admitting: Family Medicine

## 2017-11-23 VITALS — BP 150/100 | HR 83 | Temp 98.7°F | Resp 12 | Ht 70.0 in | Wt 204.2 lb

## 2017-11-23 DIAGNOSIS — Z1211 Encounter for screening for malignant neoplasm of colon: Secondary | ICD-10-CM

## 2017-11-23 DIAGNOSIS — Z0000 Encounter for general adult medical examination without abnormal findings: Secondary | ICD-10-CM | POA: Diagnosis not present

## 2017-11-23 DIAGNOSIS — I10 Essential (primary) hypertension: Secondary | ICD-10-CM | POA: Diagnosis not present

## 2017-11-23 DIAGNOSIS — Z131 Encounter for screening for diabetes mellitus: Secondary | ICD-10-CM | POA: Diagnosis not present

## 2017-11-23 DIAGNOSIS — Z23 Encounter for immunization: Secondary | ICD-10-CM | POA: Diagnosis not present

## 2017-11-23 DIAGNOSIS — M7662 Achilles tendinitis, left leg: Secondary | ICD-10-CM | POA: Diagnosis not present

## 2017-11-23 DIAGNOSIS — Z125 Encounter for screening for malignant neoplasm of prostate: Secondary | ICD-10-CM | POA: Diagnosis not present

## 2017-11-23 DIAGNOSIS — E785 Hyperlipidemia, unspecified: Secondary | ICD-10-CM | POA: Diagnosis not present

## 2017-11-23 LAB — COMPREHENSIVE METABOLIC PANEL
ALT: 45 U/L (ref 0–53)
AST: 27 U/L (ref 0–37)
Albumin: 4.6 g/dL (ref 3.5–5.2)
Alkaline Phosphatase: 89 U/L (ref 39–117)
BUN: 17 mg/dL (ref 6–23)
CO2: 28 mEq/L (ref 19–32)
Calcium: 9.5 mg/dL (ref 8.4–10.5)
Chloride: 102 mEq/L (ref 96–112)
Creatinine, Ser: 1.2 mg/dL (ref 0.40–1.50)
GFR: 68.12 mL/min (ref >60.00)
Glucose, Bld: 89 mg/dL (ref 70–99)
Potassium: 4.4 mEq/L (ref 3.5–5.1)
Sodium: 138 mEq/L (ref 135–145)
Total Bilirubin: 0.6 mg/dL (ref 0.2–1.2)
Total Protein: 7.3 g/dL (ref 6.0–8.3)

## 2017-11-23 LAB — TSH: TSH: 0.76 u[IU]/mL (ref 0.35–4.50)

## 2017-11-23 LAB — PSA: PSA: 2.53 ng/mL (ref 0.10–4.00)

## 2017-11-23 LAB — URIC ACID: Uric Acid, Serum: 9 mg/dL — ABNORMAL HIGH (ref 4.0–7.8)

## 2017-11-23 LAB — LIPID PANEL
CHOL/HDL RATIO: 4
Cholesterol: 187 mg/dL (ref 0–200)
HDL: 48.1 mg/dL (ref >39.00)
LDL Cholesterol: 117 mg/dL — ABNORMAL HIGH (ref 0–99)
NONHDL: 138.69
Triglycerides: 108 mg/dL (ref 0.0–149.0)
VLDL: 21.6 mg/dL (ref 0.0–40.0)

## 2017-11-23 LAB — HEMOGLOBIN A1C: Hgb A1c MFr Bld: 5.7 % (ref 4.6–6.5)

## 2017-11-23 MED ORDER — BENAZEPRIL-HYDROCHLOROTHIAZIDE 20-12.5 MG PO TABS: 1.0000 | ORAL_TABLET | Freq: Every day | ORAL | 1 refills | Status: DC

## 2017-11-23 MED FILL — BENAZEPRIL-HYDROCHLOROTHIAZ: 20-12.5 | 90 days supply | Qty: 90 | Fill #0

## 2017-11-23 NOTE — Assessment & Plan Note (Signed)
BP rechecked: 130/80. Because reporting mildly elevated BPs at home, HCTZ 12.5 mg added and benazepril increased from 10 mg to 20 mg. She will continue monitoring BP at home, instructed to send me BP readings in 3 to 4 weeks. Low-salt and DASH diet recommended. Eye exam is current Follow-up in 6 months, before if needed.

## 2017-11-23 NOTE — Assessment & Plan Note (Signed)
For now he will continue nonpharmacologic treatment. Further recommendations will be given according to lipid panel results as well as 10 years CVD risk score.

## 2017-11-23 NOTE — Patient Instructions (Addendum)
A few things to remember from today's visit:   Diabetes mellitus screening  Colon cancer screening  Prostate cancer screening  Hypertension, essential, benign  Hyperlipidemia, unspecified hyperlipidemia type  Benazepril increased from 10 mg to 20 mg and HCTZ 12.5 mg added.  BP readings in 3-4 weeks.  Blood pressure goal for most people is less than 140/90. Some populations (older than 60) the goal is less than 150/90.  Most recent cardiologists' recommendations recommend blood pressure at or less than 130/80.   Elevated blood pressure increases the risk of strokes, heart and kidney disease, and eye problems. Regular physical activity and a healthy diet (DASH diet) usually help. Low salt diet. Take medications as instructed.  Caution with some over the counter medications as cold medications, dietary products (for weight loss), and Ibuprofen or Aleve (frequent use);all these medications could cause elevation of blood pressure.   At least 150 minutes of moderate exercise per week, daily brisk walking for 15-30 min is a good exercise option. Healthy diet low in saturated (animal) fats and sweets and consisting of fresh fruits and vegetables, lean meats such as fish and white chicken and whole grains.  - Vaccines:  Tdap vaccine every 10 years.  Shingles vaccine recommended at age 45, could be given after 50 years of age but not sure about insurance coverage.  Pneumonia vaccines:  Prevnar 51 at 20 and Pneumovax at 78.   -Screening recommendations for low/normal risk males:  Screening for diabetes at age 23 and every 3 years. Earlier screening if cardiovascular risk factors.   Colon cancer screening at age 30 and until age 64.  Prostate cancer screening: some controversy, starts usually at 29: Rectal exam and PSA.  Aortic Abdominal Aneurism once between 25 and 39 years old if ever smoker.  Also recommended:  1. Dental visit- Brush and floss your teeth twice daily;  visit your dentist twice a year. 2. Eye doctor- Get an eye exam at least every 2 years. 3. Helmet use- Always wear a helmet when riding a bicycle, motorcycle, rollerblading or skateboarding. 4. Safe sex- If you may be exposed to sexually transmitted infections, use a condom. 5. Seat belts- Seat belts can save your live; always wear one. 6. Smoke/Carbon Monoxide detectors- These detectors need to be installed on the appropriate level of your home. Replace batteries at least once a year. 7. Skin cancer- When out in the sun please cover up and use sunscreen 15 SPF or higher. 8. Violence- If anyone is threatening or hurting you, please tell your healthcare provider.  9. Drink alcohol in moderation- Limit alcohol intake to one drink or less per day. Never drink and drive.  Please be sure medication list is accurate. If a new problem present, please set up appointment sooner than planned today.

## 2017-11-24 ENCOUNTER — Encounter: Payer: Self-pay | Admitting: Family Medicine

## 2017-11-26 DIAGNOSIS — Z1211 Encounter for screening for malignant neoplasm of colon: Secondary | ICD-10-CM | POA: Diagnosis not present

## 2017-12-02 LAB — COLOGUARD: Cologuard: NEGATIVE

## 2017-12-07 ENCOUNTER — Encounter: Payer: Self-pay | Admitting: Family Medicine

## 2018-02-25 MED FILL — BENAZEPRIL-HYDROCHLOROTHIAZ: 20-12.5 | 90 days supply | Qty: 90 | Fill #1

## 2018-04-12 ENCOUNTER — Encounter: Payer: Self-pay | Admitting: Family Medicine

## 2018-04-12 ENCOUNTER — Ambulatory Visit: Payer: 59 | Admitting: Family Medicine

## 2018-04-12 VITALS — BP 124/83 | HR 100 | Temp 98.9°F | Resp 12 | Ht 70.0 in | Wt 191.2 lb

## 2018-04-12 DIAGNOSIS — I10 Essential (primary) hypertension: Secondary | ICD-10-CM

## 2018-04-12 MED ORDER — OLMESARTAN MEDOXOMIL-HCTZ 40-12.5 MG PO TABS: 1.0000 | ORAL_TABLET | Freq: Every day | ORAL | 1 refills | Status: DC

## 2018-04-12 MED FILL — OLMESARTAN-HCTZ 40-12.5 MG: 40-12.5 | 90 days supply | Qty: 90 | Fill #0

## 2018-04-12 NOTE — Patient Instructions (Addendum)
A few things to remember from today's visit:   Hypertension, essential, benign - Plan: olmesartan-hydrochlorothiazide (BENICAR HCT) 40-12.5 MG tablet  Continue checking blood pressure. Goal for blood pressure, ideally under 130/80. Low-salt diet recommended. Let me know about blood pressure readings in 4 to 6 weeks.   Please be sure medication list is accurate. If a new problem present, please set up appointment sooner than planned today.

## 2018-04-12 NOTE — Progress Notes (Signed)
Erik Rubio. is a 53 y.o.male, who is here today to follow on HTN. He was last seen here for his CPE on 11/23/2017. Since his last visit his father was sick back in [place], so he traveled a few weeks ago.  He lost his benazepril HCTZ, so he saw his father's cardiologist, who recommended telmisartan-HCTZ 40-12.5 mg. He has tolerated medication well, no side effects except for mild urine frequency.  He was having dry cough while he was taking benazepril, resolved after medication was discontinued. BP readings at home 130s/80s.  He has not noted unusual headache, visual changes, exertional chest pain, dyspnea,  focal weakness, or edema.   Lab Results  Component Value Date   CREATININE 1.20 11/23/2017   BUN 17 11/23/2017   NA 138 11/23/2017   K 4.4 11/23/2017   CL 102 11/23/2017   CO2 28 11/23/2017    Review of Systems  Constitutional: Negative for activity change, appetite change, fatigue and fever.  HENT: Negative for mouth sores, nosebleeds and sore throat.   Eyes: Negative for redness and visual disturbance.  Respiratory: Negative for cough, shortness of breath and wheezing.   Cardiovascular: Negative for chest pain, palpitations and leg swelling.  Gastrointestinal: Negative for abdominal pain, nausea and vomiting.  Genitourinary: Negative for decreased urine volume and hematuria.  Neurological: Negative for dizziness, syncope, weakness and headaches.     No current outpatient medications on file prior to visit.   No current facility-administered medications on file prior to visit.      Past Medical History:  Diagnosis Date  . Arthritis    Knee OA  . Hypertension     No Known Allergies  Social History   Socioeconomic History  . Marital status: Married    Spouse name: Not on file  . Number of children: Not on file  . Years of education: Not on file  . Highest education level: Not on file  Occupational History  . Not on file    Social Needs  . Financial resource strain: Not on file  . Food insecurity:    Worry: Not on file    Inability: Not on file  . Transportation needs:    Medical: Not on file    Non-medical: Not on file  Tobacco Use  . Smoking status: Never Smoker  . Smokeless tobacco: Never Used  Substance and Sexual Activity  . Alcohol use: Yes    Comment: Occasional  . Drug use: No  . Sexual activity: Not on file  Lifestyle  . Physical activity:    Days per week: Not on file    Minutes per session: Not on file  . Stress: Not on file  Relationships  . Social connections:    Talks on phone: Not on file    Gets together: Not on file    Attends religious service: Not on file    Active member of club or organization: Not on file    Attends meetings of clubs or organizations: Not on file    Relationship status: Not on file  Other Topics Concern  . Not on file  Social History Narrative  . Not on file    Vitals:   04/12/18 0930  BP: 124/83  Pulse: 100  Resp: 12  Temp: 98.9 F (37.2 C)  SpO2: 99%   Body mass index is 27.44 kg/m.  Physical Exam  Nursing note and vitals reviewed. Constitutional: He is oriented to person, place, and time. He appears  well-developed and well-nourished. No distress.  HENT:  Head: Normocephalic and atraumatic.  Mouth/Throat: Oropharynx is clear and moist and mucous membranes are normal.  Eyes: Pupils are equal, round, and reactive to light. Conjunctivae are normal.  Cardiovascular: Normal rate and regular rhythm.  No murmur heard. Respiratory: Effort normal and breath sounds normal. No respiratory distress.  GI: Soft. He exhibits no mass. There is no hepatomegaly. There is no abdominal tenderness.  Musculoskeletal:        General: No edema.  Lymphadenopathy:    He has no cervical adenopathy.  Neurological: He is alert and oriented to person, place, and time. He has normal strength. No cranial nerve deficit. Gait normal.  Skin: Skin is warm. No rash  noted. No erythema.  Psychiatric: He has a normal mood and affect. Cognition and memory are normal.  Well groomed, good eye contact.    ASSESSMENT AND PLAN:   Mr. Mclean was seen today for medication follow-up.  Diagnoses and all orders for this visit:  Hypertension, essential, benign Telmisartan is not covered by his insurance, so he agrees with trying olmesartan. Ideally I would like BP =< 130/80. Olmesartan-HCTZ 40-12.5 mg daily recommended. Low-salt diet recommended. In about 4 to 6 weeks he will let me know about BP readings. I will see him back in 11/2018 for his CPE, before if needed.   Return in about 7 months (around 11/25/2018) for cpe.    Brisa Auth G. Martinique, MD  [facility]. [place] office.

## 2018-04-12 NOTE — Assessment & Plan Note (Signed)
Telmisartan is not covered by his insurance, so he agrees with trying olmesartan. Ideally I would like BP =< 130/80. Olmesartan-HCTZ 40-12.5 mg daily recommended. Low-salt diet recommended. In about 4 to 6 weeks he will let me know about BP readings. I will see him back in 11/2018 for his CPE, before if needed.

## 2018-07-26 MED FILL — OLMESARTAN-HCTZ 40-12.5 MG: 40-12.5 | 90 days supply | Qty: 90 | Fill #1

## 2018-08-02 DIAGNOSIS — H5203 Hypermetropia, bilateral: Secondary | ICD-10-CM | POA: Diagnosis not present

## 2018-08-02 DIAGNOSIS — H35033 Hypertensive retinopathy, bilateral: Secondary | ICD-10-CM | POA: Diagnosis not present

## 2018-09-28 ENCOUNTER — Telehealth: Payer: Self-pay | Admitting: Family Medicine

## 2018-09-28 ENCOUNTER — Ambulatory Visit (INDEPENDENT_AMBULATORY_CARE_PROVIDER_SITE_OTHER): Payer: 59 | Admitting: Family Medicine

## 2018-09-28 ENCOUNTER — Encounter: Payer: Self-pay | Admitting: Family Medicine

## 2018-09-28 ENCOUNTER — Telehealth: Payer: Self-pay | Admitting: *Deleted

## 2018-09-28 ENCOUNTER — Other Ambulatory Visit: Payer: Self-pay

## 2018-09-28 DIAGNOSIS — M25571 Pain in right ankle and joints of right foot: Secondary | ICD-10-CM

## 2018-09-28 MED ORDER — NAPROXEN 500 MG PO TABS: ORAL_TABLET | ORAL | 0 refills | Status: DC

## 2018-09-28 MED ORDER — NAPROXEN 500 MG PO TABS: 500.0000 mg | ORAL_TABLET | Freq: Two times a day (BID) | ORAL | 0 refills | Status: DC | PRN

## 2018-09-28 MED FILL — NAPROXEN 500 MG TABLET: 500 | 10 days supply | Qty: 20 | Fill #0

## 2018-09-28 NOTE — Progress Notes (Signed)
Virtual Visit via Video Note  I connected with [place]  on 09/28/18 at 11:00 AM EDT by a video enabled telemedicine application and verified that I am speaking with the correct person using two identifiers.  Location patient: home Location provider:work or home office Persons participating in the virtual visit: patient, provider  I discussed the limitations of evaluation and management by telemedicine and the availability of in person appointments. The patient expressed understanding and agreed to proceed.   HPI:  Acute visit for R ankle pain: -started 2 weeks ago after climbing up and down a ladder and on his roof - he was wearing sandals -got a bit swollen over the next few days, he has bad OA which flares up at times -swelling was going away, but still has some pain and with certain weight bearing movements and had pretty bad pain last night -no redness or heat -no fever, no other joint pains today  ROS: See pertinent positives and negatives per HPI.  Past Medical History:  Diagnosis Date  . Arthritis    Knee OA  . Hypertension     Past Surgical History:  Procedure Laterality Date  . CHOLECYSTECTOMY  2003    Family History  Problem Relation Age of Onset  . Stroke Mother        aneurysm  . Hypertension Mother   . Hypertension Father     SOCIAL HX: see hpi   Current Outpatient Medications:  .  olmesartan-hydrochlorothiazide (BENICAR HCT) 40-12.5 MG tablet, Take 1 tablet by mouth daily., Disp: 90 tablet, Rfl: 1 .  naproxen (NAPROSYN) 500 MG tablet, Every 12 hours with meals as needed for pain, Disp: 20 tablet, Rfl: 0  EXAM:  VITALS per patient if applicable:  GENERAL: alert, oriented, appears well and in no acute distress  HEENT: atraumatic, conjunttiva clear, no obvious abnormalities on inspection of external nose and ears  NECK: normal movements of the head and neck  LUNGS: on inspection no signs of respiratory distress, breathing rate appears normal, no  obvious gross SOB, gasping or wheezing  CV: no obvious cyanosis  MS: moves all visible extremities without noticeable abnormality, fairly normal appearance of the ankle compared to the contralateral side, no pitting edema on patient exam, patient notes area of tenderness all around the ankle joint, no TTP over bony landmarks, no discoloration of the skin  PSYCH/NEURO: pleasant and cooperative, no obvious depression or anxiety, speech and thought processing grossly intact  ASSESSMENT AND PLAN:  Discussed the following assessment and plan:  Acute right ankle pain   -we discussed possible serious and likely etiologies, workup and treatment, treatment risks and precautions. Query OA/strain or sprain vs other.  -after this discussion, [place] opted for HEP, Naproxen after discussion risks, ice, tiger balms and follow up in 2 weeks -follow up advised in 2 weeks -of course, we advised Agency  to return or notify a doctor sooner if symptoms worsen or new concerns arise.    I discussed the assessment and treatment plan with the patient. The patient was provided an opportunity to ask questions and all were answered. The patient agreed with the plan and demonstrated an understanding of the instructions.   Follow up instructions: Advised assistant Wendie Simmer to help patient arrange the following: -follow up with PCP (or me ) in 2 weeks   Lucretia Kern, DO   Patient Instructions  -alphabet exercises  -naproxen as needed per instructions  -ice is your friend  -follow up in 2 weeks, sooner  if worsening or new symptoms

## 2018-09-28 NOTE — Telephone Encounter (Signed)
Rx has been updated and sent to pharmacy.  Pt has been advised.

## 2018-09-28 NOTE — Telephone Encounter (Signed)
1 tablet q 12 hours. Thanks.

## 2018-09-28 NOTE — Patient Instructions (Signed)
-  alphabet exercises  -naproxen as needed per instructions  -ice is your friend  -follow up in 2 weeks, sooner if worsening or new symptoms

## 2018-09-28 NOTE — Telephone Encounter (Signed)
Appt scheduled for 8/11 at 7:30am as per pts preference he would like a visit in 1 week.

## 2018-09-28 NOTE — Telephone Encounter (Signed)
-----   Message from Lucretia Kern, DO sent at 09/28/2018 11:22 AM EDT ----- Follow up with PCP (or me) in 2 weeks

## 2018-09-28 NOTE — Telephone Encounter (Signed)
The medication was Naprosyn and it was sent at the time of his visit per chart to [place]. Can you please assist and check with pharmacy on this? Thanks!

## 2018-09-28 NOTE — Telephone Encounter (Signed)
Pt states he was told on a virtual visit this morning that Mobic would be called in for him.  Pt has gone to the [facility] but they have nothing on file for him.  Pt would like this to be called in ASAP and he would like a call once done so he knows to go and pick it up.

## 2018-09-28 NOTE — Telephone Encounter (Signed)
Spoke with pharmacy and rx has no take quantity listed in sig. Only states "take every 12 hours". They cannot dispense without clarification.  I was looking for Mobic in chart and did not realize it should have been Naprosyn! Thanks!

## 2018-10-05 ENCOUNTER — Telehealth: Payer: 59 | Admitting: Family Medicine

## 2018-11-30 ENCOUNTER — Other Ambulatory Visit: Payer: Self-pay

## 2018-11-30 ENCOUNTER — Ambulatory Visit (INDEPENDENT_AMBULATORY_CARE_PROVIDER_SITE_OTHER): Payer: 59 | Admitting: Family Medicine

## 2018-11-30 ENCOUNTER — Encounter: Payer: Self-pay | Admitting: Family Medicine

## 2018-11-30 VITALS — BP 116/70 | HR 86 | Temp 98.2°F | Resp 12 | Ht 70.0 in | Wt 200.0 lb

## 2018-11-30 DIAGNOSIS — E785 Hyperlipidemia, unspecified: Secondary | ICD-10-CM | POA: Diagnosis not present

## 2018-11-30 DIAGNOSIS — Z130 Encounter for screening for diseases of the blood and blood-forming organs and certain disorders involving the immune mechanism: Secondary | ICD-10-CM | POA: Diagnosis not present

## 2018-11-30 DIAGNOSIS — M25572 Pain in left ankle and joints of left foot: Secondary | ICD-10-CM

## 2018-11-30 DIAGNOSIS — Z23 Encounter for immunization: Secondary | ICD-10-CM | POA: Diagnosis not present

## 2018-11-30 DIAGNOSIS — Z1329 Encounter for screening for other suspected endocrine disorder: Secondary | ICD-10-CM | POA: Diagnosis not present

## 2018-11-30 DIAGNOSIS — M25571 Pain in right ankle and joints of right foot: Secondary | ICD-10-CM | POA: Diagnosis not present

## 2018-11-30 DIAGNOSIS — I10 Essential (primary) hypertension: Secondary | ICD-10-CM | POA: Diagnosis not present

## 2018-11-30 DIAGNOSIS — Z0000 Encounter for general adult medical examination without abnormal findings: Secondary | ICD-10-CM

## 2018-11-30 DIAGNOSIS — M109 Gout, unspecified: Secondary | ICD-10-CM

## 2018-11-30 DIAGNOSIS — Z13228 Encounter for screening for other metabolic disorders: Secondary | ICD-10-CM | POA: Diagnosis not present

## 2018-11-30 LAB — LIPID PANEL
Cholesterol: 195 mg/dL (ref 0–200)
HDL: 52.5 mg/dL (ref >39.00)
LDL Cholesterol: 121 mg/dL — ABNORMAL HIGH (ref 0–99)
NonHDL: 142.15
Total CHOL/HDL Ratio: 4
Triglycerides: 106 mg/dL (ref 0.0–149.0)
VLDL: 21.2 mg/dL (ref 0.0–40.0)

## 2018-11-30 LAB — COMPREHENSIVE METABOLIC PANEL
ALT: 50 U/L (ref 0–53)
AST: 31 U/L (ref 0–37)
Albumin: 4.9 g/dL (ref 3.5–5.2)
Alkaline Phosphatase: 79 U/L (ref 39–117)
BUN: 22 mg/dL (ref 6–23)
CO2: 30 mEq/L (ref 19–32)
Calcium: 10.3 mg/dL (ref 8.4–10.5)
Chloride: 99 mEq/L (ref 96–112)
Creatinine, Ser: 1.32 mg/dL (ref 0.40–1.50)
GFR: 57.19 mL/min — ABNORMAL LOW (ref >60.00)
Glucose, Bld: 105 mg/dL — ABNORMAL HIGH (ref 70–99)
Potassium: 4.7 mEq/L (ref 3.5–5.1)
Sodium: 138 mEq/L (ref 135–145)
Total Bilirubin: 0.5 mg/dL (ref 0.2–1.2)
Total Protein: 8.1 g/dL (ref 6.0–8.3)

## 2018-11-30 LAB — C-REACTIVE PROTEIN: CRP: <1 mg/dL (ref 0.5–20.0)

## 2018-11-30 LAB — URIC ACID: Uric Acid, Serum: 11.3 mg/dL — ABNORMAL HIGH (ref 4.0–7.8)

## 2018-11-30 LAB — HEMOGLOBIN A1C: Hgb A1c MFr Bld: 5.9 % (ref 4.6–6.5)

## 2018-11-30 LAB — SEDIMENTATION RATE: Sed Rate: 13 mm/hr (ref 0–20)

## 2018-11-30 NOTE — Patient Instructions (Signed)
A few things to remember from today's visit:   Routine general medical examination at a health care facility  Hypertension, essential, benign  Hyperlipidemia, unspecified hyperlipidemia type - Plan: Lipid panel  Bilateral ankle pain, unspecified chronicity - Plan: C-reactive protein, Sedimentation rate, Uric acid, Uric acid, random urine  Screening for endocrine, metabolic and immunity disorder - Plan: Comprehensive metabolic panel, Hemoglobin A1c   At least 150 minutes of moderate exercise per week, daily brisk walking for 15-30 min is a good exercise option. Healthy diet low in saturated (animal) fats and sweets and consisting of fresh fruits and vegetables, lean meats such as fish and white chicken and whole grains.  - Vaccines:  Tdap vaccine every 10 years.  Shingles vaccine recommended at age 70, could be given after 51 years of age but not sure about insurance coverage.  Pneumonia vaccines:  Prevnar 17 at 6 and Pneumovax at 60.   -Screening recommendations for low/normal risk males:  Screening for diabetes at age 99 and every 3 years. Earlier screening if cardiovascular risk factors.  Colon cancer screening at age 27 and until age 1.  Prostate cancer screening: some controversy, starts usually at 75: Rectal exam and PSA.  Aortic Abdominal Aneurism once between 37 and 27 years old if ever smoker.  Also recommended:  1. Dental visit- Brush and floss your teeth twice daily; visit your dentist twice a year. 2. Eye doctor- Get an eye exam at least every 2 years. 3. Helmet use- Always wear a helmet when riding a bicycle, motorcycle, rollerblading or skateboarding. 4. Safe sex- If you may be exposed to sexually transmitted infections, use a condom. 5. Seat belts- Seat belts can save your live; always wear one. 6. Smoke/Carbon Monoxide detectors- These detectors need to be installed on the appropriate level of your home. Replace batteries at least once a year. 7. Skin  cancer- When out in the sun please cover up and use sunscreen 15 SPF or higher. 8. Violence- If anyone is threatening or hurting you, please tell your healthcare provider.  9. Drink alcohol in moderation- Limit alcohol intake to one drink or less per day. Never drink and drive.  Please be sure medication list is accurate. If a new problem present, please set up appointment sooner than planned today.

## 2018-11-30 NOTE — Progress Notes (Signed)
HPI:  Mr. Red Mandt. is a 53 y.o.male here today for his routine physical examination.  Last CPE: 11/23/17. He lives with his wife and 2 children (in their 20's).  Regular exercise 3 or more times per week: Not consistently due to joint pain. Following a healthy diet: He is doing intermittent fasting.  Chronic medical problems: HTN,OA,and HLD among some.  Hx of STD's: Negative.  Immunization History  Administered Date(s) Administered  . Influenza,inj,Quad PF,6+ Mos 11/23/2017, 11/30/2018  . Influenza-Unspecified 11/18/2016    Last colon cancer screening: Cologuad negative in 2019. Last prostate ca screening: 10/2018 PSA 2.5. Nocturia x 1 stable for years.  -Denies high alcohol intake, tobacco use, or Hx of illicit drug use.  -Concerns and/or follow up today: ankles pain. Intermittent "very painful" bilateral ankle pain. It is worse at night, interfering with sleep. No Hx of trauma.  He has noted mild edema and some erythema. He wonders if pain is caused by gout,no prior Hx but uric acid has been elevated in theapast.  He was prescribed Naproxen 500 mg,which really helps.  It seems to be exacerbated by wine but it happens sometimes even with no prior wine consumption. Pain is exacerbated by movement,standing,walking, and palpation, alleviated by rest.  He has Hx of OA and reports having negative rheumatologic work up.  HTN: He is on Olmesartan-HCTZ 40-12.5 mg daily. BP readings at home 120's-130/70's. Denies severe/frequent headache, visual changes, chest pain, dyspnea, palpitation, claudication, focal weakness, or edema.  HLD: He is on non pharmacologic treatment. 11/23/17: TC 187,HDL 48,LDL 117,and TG 108.   Review of Systems  Constitutional: Negative for activity change, appetite change, chills, fatigue, fever and unexpected weight change.  HENT: Negative for dental problem, hearing loss, nosebleeds, sore throat and trouble swallowing.    Eyes: Negative for redness and visual disturbance.  Respiratory: Negative for cough and wheezing.   Gastrointestinal: Negative for abdominal pain, blood in stool, nausea and vomiting.  Endocrine: Negative for cold intolerance, heat intolerance, polydipsia, polyphagia and polyuria.  Genitourinary: Negative for decreased urine volume, dysuria, genital sores, hematuria and testicular pain.  Musculoskeletal: Negative for gait problem and myalgias.  Skin: Negative for color change and rash.  Allergic/Immunologic: Positive for environmental allergies.  Neurological: Negative for dizziness, syncope, weakness and headaches.  Hematological: Negative for adenopathy. Does not bruise/bleed easily.  Psychiatric/Behavioral: Negative for confusion and sleep disturbance. The patient is not nervous/anxious.   All other systems reviewed and are negative.   Current Outpatient Medications on File Prior to Visit  Medication Sig Dispense Refill  . olmesartan-hydrochlorothiazide (BENICAR HCT) 40-12.5 MG tablet Take 1 tablet by mouth daily. 90 tablet 1   No current facility-administered medications on file prior to visit.      Past Medical History:  Diagnosis Date  . Arthritis    Knee OA  . Hypertension     Past Surgical History:  Procedure Laterality Date  . CHOLECYSTECTOMY  2003    No Known Allergies  Family History  Problem Relation Age of Onset  . Stroke Mother        aneurysm  . Hypertension Mother   . Hypertension Father     Social History   Socioeconomic History  . Marital status: Married    Spouse name: Not on file  . Number of children: Not on file  . Years of education: Not on file  . Highest education level: Not on file  Occupational History  . Not on file  Social  Needs  . Financial resource strain: Not on file  . Food insecurity    Worry: Not on file    Inability: Not on file  . Transportation needs    Medical: Not on file    Non-medical: Not on file  Tobacco Use   . Smoking status: Never Smoker  . Smokeless tobacco: Never Used  Substance and Sexual Activity  . Alcohol use: Yes    Comment: Occasional  . Drug use: No  . Sexual activity: Not on file  Lifestyle  . Physical activity    Days per week: Not on file    Minutes per session: Not on file  . Stress: Not on file  Relationships  . Social Herbalist on phone: Not on file    Gets together: Not on file    Attends religious service: Not on file    Active member of club or organization: Not on file    Attends meetings of clubs or organizations: Not on file    Relationship status: Not on file  Other Topics Concern  . Not on file  Social History Narrative  . Not on file    Vitals:   11/30/18 0804  BP: 116/70  Pulse: 86  Resp: 12  Temp: 98.2 F (36.8 C)  SpO2: 100%   Body mass index is 28.7 kg/m.   Wt Readings from Last 3 Encounters:  11/30/18 200 lb (90.7 kg)  04/12/18 191 lb 4 oz (86.8 kg)  11/23/17 204 lb 4 oz (92.6 kg)    Physical Exam  Nursing note and vitals reviewed. Constitutional: He is oriented to person, place, and time. He appears well-developed. No distress.  HENT:  Head: Normocephalic and atraumatic.  Right Ear: Tympanic membrane, external ear and ear canal normal.  Left Ear: Tympanic membrane, external ear and ear canal normal.  Mouth/Throat: Oropharynx is clear and moist and mucous membranes are normal.  Eyes: Pupils are equal, round, and reactive to light. Conjunctivae and EOM are normal.  Neck: Normal range of motion. No tracheal deviation present. No thyromegaly present.  Cardiovascular: Normal rate and regular rhythm.  No murmur heard. Pulses:      Dorsalis pedis pulses are 2+ on the right side and 2+ on the left side.  Respiratory: Effort normal and breath sounds normal. No respiratory distress.  GI: Soft. He exhibits no mass. There is no hepatomegaly. There is no abdominal tenderness.  Genitourinary:    Genitourinary Comments: Refused,no  concerns.   Musculoskeletal:        General: No tenderness or edema.     Right ankle: He exhibits normal range of motion and no deformity.     Left ankle: He exhibits normal range of motion and no deformity.     Comments: No major deformities appreciated and no signs of synovitis. Left ankle tenderness elicited by extension. No edema or erythema.    Lymphadenopathy:    He has no cervical adenopathy.       Right: No supraclavicular adenopathy present.       Left: No supraclavicular adenopathy present.  Neurological: He is alert and oriented to person, place, and time. He has normal strength. No cranial nerve deficit or sensory deficit. Coordination and gait normal.  Reflex Scores:      Bicep reflexes are 2+ on the right side and 2+ on the left side.      Patellar reflexes are 2+ on the right side and 2+ on the left side. Skin: Skin  is warm. No erythema.  Psychiatric: He has a normal mood and affect. Cognition and memory are normal.  Well groomed, good eye contact.    ASSESSMENT AND PLAN:  Mr. Zared was seen today for annual exam.  Diagnoses and all orders for this visit:  Orders Placed This Encounter  Procedures  . Flu Vaccine QUAD 36+ mos IM  . Comprehensive metabolic panel  . Lipid panel  . Hemoglobin A1c  . C-reactive protein  . Sedimentation rate  . Uric acid  . Uric acid, random urine   Lab Results  Component Value Date   CHOL 195 11/30/2018   HDL 52.50 11/30/2018   LDLCALC 121 (H) 11/30/2018   TRIG 106.0 11/30/2018   CHOLHDL 4 11/30/2018   Lab Results  Component Value Date   LABURIC 11.3 (H) 11/30/2018   Lab Results  Component Value Date   CRP <1.0 11/30/2018   Lab Results  Component Value Date   HGBA1C 5.9 11/30/2018   Lab Results  Component Value Date   ESRSEDRATE 13 11/30/2018    Lab Results  Component Value Date   ALT 50 11/30/2018   AST 31 11/30/2018   ALKPHOS 79 11/30/2018   BILITOT 0.5 11/30/2018    Routine general medical  examination at a health care facility We discussed the importance of regular physical activity and healthy diet for prevention of chronic illness and/or complications. Preventive guidelines reviewed. Vaccination up to date.  Next CPE in a year.  The 10-year ASCVD risk score Mikey Bussing DC Brooke Bonito., et al., 2013) is: 3.1%   Values used to calculate the score:     Age: 41 years     Sex: Male     Is Non-Hispanic African American: No     Diabetic: No     Tobacco smoker: No     Systolic Blood Pressure: 284 mmHg     Is BP treated: Yes     HDL Cholesterol: 52.5 mg/dL     Total Cholesterol: 195 mg/dL  Hypertension, essential, benign BP adequately controlled. No changes in current management. Continue low salt diet and monitoring BP regularly.  Hyperlipidemia, unspecified hyperlipidemia type Continue low falt diet. Further recommendations will be given according to lipid results as well as 10 years CV risk.  Bilateral ankle pain, unspecified chronicity Hx suggest inflammatory process, gout is a good possibility. He is not interested in starting Allopurinol, he would like to continue with Naproxen. We discussed adverse effects of chronic NSAID's intake. Further recommendations will be given according to lab results.  Screening for endocrine, metabolic and immunity disorder -     Comprehensive metabolic panel -     Hemoglobin A1c  Need for influenza vaccination -     Flu Vaccine QUAD 36+ mos IM   Return in 6 months (on 05/31/2019) for HTN and joint pain.    Betty G. Martinique, MD  [facility] [place] office.

## 2018-12-01 ENCOUNTER — Other Ambulatory Visit: Payer: Self-pay | Admitting: Family Medicine

## 2018-12-01 ENCOUNTER — Encounter: Payer: Self-pay | Admitting: Family Medicine

## 2018-12-01 LAB — URIC ACID, RANDOM URINE: Uric Acid, Urine: 25 mg/dL

## 2018-12-01 MED ORDER — NAPROXEN 500 MG PO TABS: 500.0000 mg | ORAL_TABLET | Freq: Two times a day (BID) | ORAL | 0 refills | Status: DC | PRN

## 2018-12-01 MED FILL — NAPROXEN 500 MG TABLET: 500 | 10 days supply | Qty: 20 | Fill #0

## 2018-12-04 ENCOUNTER — Encounter: Payer: Self-pay | Admitting: Family Medicine

## 2018-12-04 MED ORDER — ALLOPURINOL 100 MG PO TABS: 100.0000 mg | ORAL_TABLET | Freq: Every day | ORAL | 5 refills | Status: DC

## 2018-12-04 MED ORDER — COLCHICINE 0.6 MG PO TABS: 0.6000 mg | ORAL_TABLET | Freq: Two times a day (BID) | ORAL | 1 refills | Status: DC | PRN

## 2018-12-06 MED FILL — ALLOPURINOL 100 MG TABS: 100 | 30 days supply | Qty: 30 | Fill #0

## 2018-12-06 MED FILL — COLCHICINE 0.6 MG TABS: 0.6 | 15 days supply | Qty: 30 | Fill #0

## 2019-01-03 MED FILL — ALLOPURINOL 100 MG TABS: 100 | 30 days supply | Qty: 30 | Fill #1

## 2019-01-28 ENCOUNTER — Other Ambulatory Visit: Payer: Self-pay | Admitting: Family Medicine

## 2019-01-28 DIAGNOSIS — I10 Essential (primary) hypertension: Secondary | ICD-10-CM

## 2019-01-28 MED FILL — ALLOPURINOL 100 MG TABS: 100 | 30 days supply | Qty: 30 | Fill #2

## 2019-01-31 MED FILL — OLMESARTAN-HCTZ 40-12.5 MG: 40-12.5 | 90 days supply | Qty: 90 | Fill #0

## 2019-02-28 MED FILL — COLCHICINE 0.6 MG TABS: 0.6 | 15 days supply | Qty: 30 | Fill #1

## 2019-02-28 MED FILL — ALLOPURINOL 100 MG TABS: 100 | 30 days supply | Qty: 30 | Fill #3

## 2019-04-04 MED FILL — ALLOPURINOL 100 MG TABS: 100 | 30 days supply | Qty: 30 | Fill #4

## 2019-05-03 MED FILL — ALLOPURINOL 100 MG TABS: 100 | 30 days supply | Qty: 30 | Fill #5

## 2019-05-23 DIAGNOSIS — Z23 Encounter for immunization: Secondary | ICD-10-CM | POA: Diagnosis not present

## 2019-05-30 ENCOUNTER — Ambulatory Visit (INDEPENDENT_AMBULATORY_CARE_PROVIDER_SITE_OTHER): Payer: 59 | Admitting: Family Medicine

## 2019-05-30 ENCOUNTER — Encounter: Payer: Self-pay | Admitting: Family Medicine

## 2019-05-30 ENCOUNTER — Other Ambulatory Visit: Payer: Self-pay

## 2019-05-30 VITALS — BP 122/82 | HR 92 | Temp 98.6°F | Resp 12 | Ht 70.0 in | Wt 195.0 lb

## 2019-05-30 DIAGNOSIS — M109 Gout, unspecified: Secondary | ICD-10-CM

## 2019-05-30 DIAGNOSIS — R7303 Prediabetes: Secondary | ICD-10-CM

## 2019-05-30 DIAGNOSIS — R944 Abnormal results of kidney function studies: Secondary | ICD-10-CM | POA: Diagnosis not present

## 2019-05-30 DIAGNOSIS — I10 Essential (primary) hypertension: Secondary | ICD-10-CM | POA: Diagnosis not present

## 2019-05-30 LAB — URINALYSIS, ROUTINE W REFLEX MICROSCOPIC
Bilirubin Urine: NEGATIVE
Hgb urine dipstick: NEGATIVE
Ketones, ur: NEGATIVE
Leukocytes,Ua: NEGATIVE
Nitrite: NEGATIVE
RBC / HPF: NONE SEEN (ref 0–?)
Specific Gravity, Urine: 1.01 (ref 1.000–1.030)
Total Protein, Urine: NEGATIVE
Urine Glucose: NEGATIVE
Urobilinogen, UA: 0.2 (ref 0.0–1.0)
pH: 6 (ref 5.0–8.0)

## 2019-05-30 LAB — URIC ACID: Uric Acid, Serum: 8.4 mg/dL — ABNORMAL HIGH (ref 4.0–7.8)

## 2019-05-30 LAB — BASIC METABOLIC PANEL
BUN: 19 mg/dL (ref 6–23)
CO2: 29 mEq/L (ref 19–32)
Calcium: 10 mg/dL (ref 8.4–10.5)
Chloride: 99 mEq/L (ref 96–112)
Creatinine, Ser: 1.34 mg/dL (ref 0.40–1.50)
GFR: 56.09 mL/min — ABNORMAL LOW (ref >60.00)
Glucose, Bld: 99 mg/dL (ref 70–99)
Potassium: 4.6 mEq/L (ref 3.5–5.1)
Sodium: 136 mEq/L (ref 135–145)

## 2019-05-30 LAB — HEMOGLOBIN A1C: Hgb A1c MFr Bld: 5.7 % (ref 4.6–6.5)

## 2019-05-30 MED ORDER — ALLOPURINOL 100 MG PO TABS: 100.0000 mg | ORAL_TABLET | Freq: Every day | ORAL | 2 refills | Status: DC

## 2019-05-30 NOTE — Progress Notes (Signed)
HPI:   Erik Rubio. is a 54 y.o. male, who is here today for chronic disease management.  He was last seen on 11/30/18. Hyperuricemia/gout: He is on Allopurinol 100 mg daily. Colchicine 0.6 mg daily as needed.  Episodes of acute ankle pain have improved. Some intermittent achy like pain in IP hands, no associated edema or erythema. No limitation of ROM or limiting ADL's.  Tolerating medication well. Also following low purine diet.   Lab Results  Component Value Date   LABURIC 11.3 (H) 11/30/2018   HTN: Chronic problem. He is on Olmesartan-HCTZ 40-12.5 mg daily. Negative for severe/frequent headache, visual changes, chest pain, dyspnea, palpitation, focal weakness, or edema.  Component     Latest Ref Rng & Units 11/30/2018  Sodium     135 - 145 mEq/L 138  Potassium     3.5 - 5.1 mEq/L 4.7  Chloride     96 - 112 mEq/L 99  CO2     19 - 32 mEq/L 30  Glucose     70 - 99 mg/dL 105 (H)  BUN     6 - 23 mg/dL 22  Creatinine     0.40 - 1.50 mg/dL 1.32  Total Bilirubin     0.2 - 1.2 mg/dL 0.5  Alkaline Phosphatase     39 - 117 U/L 79  AST     0 - 37 U/L 31  ALT     0 - 53 U/L 50  Total Protein     6.0 - 8.3 g/dL 8.1  Albumin     3.5 - 5.2 g/dL 4.9  Calcium     8.4 - 10.5 mg/dL 10.3  GFR     >60.00 mL/min 57.19 (L)    BP readings at home: 120's/70-80.  Prediabetes: Denies abdominal pain, nausea,vomiting, polydipsia,polyuria, or polyphagia. HgA1C on 11/30/18 was 5.7.  He is following a healthier diet and has lost some wt.  Review of Systems  Constitutional: Negative for activity change, appetite change, fatigue and fever.  HENT: Negative for mouth sores, nosebleeds and sore throat.   Respiratory: Negative for cough and wheezing.   Gastrointestinal:       No changes in bowel habits.  Genitourinary: Negative for decreased urine volume, dysuria and hematuria.  Musculoskeletal: Negative for gait problem and myalgias.  Skin: Negative  for rash and wound.  Neurological: Negative for syncope, facial asymmetry and speech difficulty.  Hematological: Negative for adenopathy. Does not bruise/bleed easily.  Rest of ROS, see pertinent positives sand negatives in HPI  Current Outpatient Medications on File Prior to Visit  Medication Sig Dispense Refill  . acetaminophen (TYLENOL) 500 MG tablet Take 500 mg by mouth every 6 (six) hours as needed.    . colchicine 0.6 MG tablet Take 1 tablet (0.6 mg total) by mouth 2 (two) times daily as needed. 30 tablet 1  . olmesartan-hydrochlorothiazide (BENICAR HCT) 40-12.5 MG tablet TAKE 1 TABLET BY MOUTH DAILY. 90 tablet 1   No current facility-administered medications on file prior to visit.     Past Medical History:  Diagnosis Date  . Arthritis    Knee OA  . Hypertension    No Known Allergies  Social History   Socioeconomic History  . Marital status: Married    Spouse name: Not on file  . Number of children: Not on file  . Years of education: Not on file  . Highest education level: Not on file  Occupational History  .  Not on file  Tobacco Use  . Smoking status: Never Smoker  . Smokeless tobacco: Never Used  Substance and Sexual Activity  . Alcohol use: Yes    Comment: Occasional  . Drug use: No  . Sexual activity: Not on file  Other Topics Concern  . Not on file  Social History Narrative  . Not on file   Social Determinants of Health   Financial Resource Strain:   . Difficulty of Paying Living Expenses:   Food Insecurity:   . Worried About Charity fundraiser in the Last Year:   . Arboriculturist in the Last Year:   Transportation Needs:   . Film/video editor (Medical):   Marland Kitchen Lack of Transportation (Non-Medical):   Physical Activity:   . Days of Exercise per Week:   . Minutes of Exercise per Session:   Stress:   . Feeling of Stress :   Social Connections:   . Frequency of Communication with Friends and Family:   . Frequency of Social Gatherings with  Friends and Family:   . Attends Religious Services:   . Active Member of Clubs or Organizations:   . Attends Archivist Meetings:   Marland Kitchen Marital Status:     Vitals:   05/30/19 0755  BP: 122/82  Pulse: 92  Resp: 12  Temp: 98.6 F (37 C)  SpO2: 98%   Wt Readings from Last 3 Encounters:  05/30/19 195 lb (88.5 kg)  11/30/18 200 lb (90.7 kg)  04/12/18 191 lb 4 oz (86.8 kg)   Body mass index is 27.98 kg/m.   Physical Exam  Nursing note and vitals reviewed. Constitutional: He is oriented to person, place, and time. He appears well-developed. No distress.  HENT:  Head: Normocephalic and atraumatic.  Mouth/Throat: Oropharynx is clear and moist and mucous membranes are normal.  Eyes: Pupils are equal, round, and reactive to light. Conjunctivae are normal.  Cardiovascular: Normal rate and regular rhythm.  No murmur heard. Pulses:      Dorsalis pedis pulses are 2+ on the right side and 2+ on the left side.  Respiratory: Effort normal and breath sounds normal. No respiratory distress.  GI: Soft. He exhibits no mass. There is no hepatomegaly. There is no abdominal tenderness.  Musculoskeletal:        General: No edema.  Lymphadenopathy:    He has no cervical adenopathy.  Neurological: He is alert and oriented to person, place, and time. He has normal strength. No cranial nerve deficit. Gait normal.  Skin: Skin is warm. No rash noted. No erythema.  Psychiatric: He has a normal mood and affect.  Well groomed, good eye contact.   ASSESSMENT AND PLAN:   [name]. was seen today for chronic disease management.  Orders Placed This Encounter  Procedures  . Basic metabolic panel  . Urinalysis, Routine w reflex microscopic  . Uric acid  . Hemoglobin A1c   Lab Results  Component Value Date   HGBA1C 5.7 05/30/2019   Lab Results  Component Value Date   CREATININE 1.34 05/30/2019   BUN 19 05/30/2019   NA 136 05/30/2019   K 4.6 05/30/2019   CL 99  05/30/2019   CO2 29 05/30/2019    Lab Results  Component Value Date   LABURIC 8.4 (H) 05/30/2019    1. Gouty arthritis of both ankles Problem is well controlled. No changes in current management. Further recommendations according to lab results.  - allopurinol (ZYLOPRIM)  100 MG tablet; Take 1 tablet (100 mg total) by mouth daily.  Dispense: 90 tablet; Refill: 2 - Uric acid  2. Hypertension, essential, benign BP adequately controlled. No changes in current management. Continue low salt diet and monitoring BP regularly.  3. Prediabetes Continue a healthy life style for primary prevention of diabetes.  4. Abnormal renal function test Continue Olmesartan. Adequate BP controlled and hydration. No NSAID's and continue low salt diet.   Return in about 6 months (around 12/07/2019) for cpe.    Menashe Kafer G. [place], MD  [facility]. [place] office.    A few things to remember from today's visit:   Hypertension, essential, benign - Plan: Basic metabolic panel, Urinalysis, Routine w reflex microscopic  Gouty arthritis of both ankles - Plan: allopurinol (ZYLOPRIM) 100 MG tablet, Uric acid  Prediabetes - Plan: Hemoglobin A1c No changes today.  Low-Purine Diet Purines are compounds that affect the level of uric acid in your body. A low-purine diet is a diet that is low in purines. Eating a low-purine diet can prevent the level of uric acid in your body from getting too high and causing gout or kidney stones or both. WHAT DO I NEED TO KNOW ABOUT THIS DIET?  Choose low-purine foods. Examples of low-purine foods are listed in the next section.  Drink plenty of fluids, especially water. Fluids can help remove uric acid from your body. Try to drink 8-16 cups (1.9-3.8 L) a day.  Limit foods high in fat, especially saturated fat, as fat makes it harder for the body to get rid of uric acid. Foods high in saturated fat include pizza, cheese, ice cream, whole milk, fried  foods, and gravies. Choose foods that are lower in fat and lean sources of protein. Use olive oil when cooking as it contains healthy fats that are not high in saturated fat.  Limit alcohol. Alcohol interferes with the elimination of uric acid from your body. If you are having a gout attack, avoid all alcohol.  Keep in mind that different people's bodies react differently to different foods. You will probably learn over time which foods do or do not affect you. If you discover that a food tends to cause your gout to flare up, avoid eating that food. You can more freely enjoy foods that do not cause problems. If you have any questions about a food item, talk to your dietitian or health care provider. WHICH FOODS ARE LOW, MODERATE, AND HIGH IN PURINES? The following is a list of foods that are low, moderate, and high in purines. You can eat any amount of the foods that are low in purines. You may be able to have small amounts of foods that are moderate in purines. Ask your health care provider how much of a food moderate in purines you can have. Avoid foods high in purines. Grains  Foods low in purines: Enriched white bread, pasta, rice, cake, cornbread, popcorn.  Foods moderate in purines: Whole-grain breads and cereals, wheat germ, bran, oatmeal. Uncooked oatmeal. Dry wheat bran or wheat germ.  Foods high in purines: Pancakes, [place] toast, biscuits, muffins. Vegetables  Foods low in purines: All vegetables, except those that are moderate in purines.  Foods moderate in purines: Asparagus, cauliflower, spinach, mushrooms, green peas. Fruits  All fruits are low in purines. Meats and other Protein Foods  Foods low in purines: Eggs, nuts, peanut butter.  Foods moderate in purines: 80-90% lean beef, lamb, veal, pork, poultry, fish, eggs, peanut butter, nuts. Crab,  lobster, oysters, and shrimp. Cooked dried beans, peas, and lentils.  Foods high in purines: Anchovies, sardines, herring, mussels,  tuna, codfish, scallops, trout, and haddock. Berniece Salines. Organ meats (such as liver or kidney). Tripe. Game meat. Goose. Sweetbreads. Dairy  All dairy foods are low in purines. Low-fat and fat-free dairy products are best because they are low in saturated fat. Beverages  Drinks low in purines: Water, carbonated beverages, tea, coffee, cocoa.  Drinks moderate in purines: Soft drinks and other drinks sweetened with high-fructose corn syrup. Juices. To find whether a food or drink is sweetened with high-fructose corn syrup, look at the ingredients list.  Drinks high in purines: Alcoholic beverages (such as beer). Condiments  Foods low in purines: Salt, herbs, olives, pickles, relishes, vinegar.  Foods moderate in purines: Butter, margarine, oils, mayonnaise. Fats and Oils  Foods low in purines: All types, except gravies and sauces made with meat.  Foods high in purines: Gravies and sauces made with meat. Other Foods  Foods low in purines: Sugars, sweets, gelatin. Cake. Soups made without meat.  Foods moderate in purines: Meat-based or fish-based soups, broths, or bouillons. Foods and drinks sweetened with high-fructose corn syrup.  Foods high in purines: High-fat desserts (such as ice cream, cookies, cakes, pies, doughnuts, and chocolate). Contact your dietitian for more information on foods that are not listed here.   This information is not intended to replace advice given to you by your health care provider. Make sure you discuss any questions you have with your health care provider.   Document Released: 06/07/2010 Document Revised: 02/15/2013 Document Reviewed: 01/17/2013 Elsevier Interactive Patient Education [employer].   Please be sure medication list is accurate. If a new problem present, please set up appointment sooner than planned today.

## 2019-05-30 NOTE — Patient Instructions (Signed)
A few things to remember from today's visit:   Hypertension, essential, benign - Plan: Basic metabolic panel, Urinalysis, Routine w reflex microscopic  Gouty arthritis of both ankles - Plan: allopurinol (ZYLOPRIM) 100 MG tablet, Uric acid  Prediabetes - Plan: Hemoglobin A1c No changes today.  Low-Purine Diet Purines are compounds that affect the level of uric acid in your body. A low-purine diet is a diet that is low in purines. Eating a low-purine diet can prevent the level of uric acid in your body from getting too high and causing gout or kidney stones or both. WHAT DO I NEED TO KNOW ABOUT THIS DIET?  Choose low-purine foods. Examples of low-purine foods are listed in the next section.  Drink plenty of fluids, especially water. Fluids can help remove uric acid from your body. Try to drink 8-16 cups (1.9-3.8 L) a day.  Limit foods high in fat, especially saturated fat, as fat makes it harder for the body to get rid of uric acid. Foods high in saturated fat include pizza, cheese, ice cream, whole milk, fried foods, and gravies. Choose foods that are lower in fat and lean sources of protein. Use olive oil when cooking as it contains healthy fats that are not high in saturated fat.  Limit alcohol. Alcohol interferes with the elimination of uric acid from your body. If you are having a gout attack, avoid all alcohol.  Keep in mind that different people's bodies react differently to different foods. You will probably learn over time which foods do or do not affect you. If you discover that a food tends to cause your gout to flare up, avoid eating that food. You can more freely enjoy foods that do not cause problems. If you have any questions about a food item, talk to your dietitian or health care provider. WHICH FOODS ARE LOW, MODERATE, AND HIGH IN PURINES? The following is a list of foods that are low, moderate, and high in purines. You can eat any amount of the foods that are low in purines.  You may be able to have small amounts of foods that are moderate in purines. Ask your health care provider how much of a food moderate in purines you can have. Avoid foods high in purines. Grains  Foods low in purines: Enriched white bread, pasta, rice, cake, cornbread, popcorn.  Foods moderate in purines: Whole-grain breads and cereals, wheat germ, bran, oatmeal. Uncooked oatmeal. Dry wheat bran or wheat germ.  Foods high in purines: Pancakes, [place] toast, biscuits, muffins. Vegetables  Foods low in purines: All vegetables, except those that are moderate in purines.  Foods moderate in purines: Asparagus, cauliflower, spinach, mushrooms, green peas. Fruits  All fruits are low in purines. Meats and other Protein Foods  Foods low in purines: Eggs, nuts, peanut butter.  Foods moderate in purines: 80-90% lean beef, lamb, veal, pork, poultry, fish, eggs, peanut butter, nuts. Crab, lobster, oysters, and shrimp. Cooked dried beans, peas, and lentils.  Foods high in purines: Anchovies, sardines, herring, mussels, tuna, codfish, scallops, trout, and haddock. Berniece Salines. Organ meats (such as liver or kidney). Tripe. Game meat. Goose. Sweetbreads. Dairy  All dairy foods are low in purines. Low-fat and fat-free dairy products are best because they are low in saturated fat. Beverages  Drinks low in purines: Water, carbonated beverages, tea, coffee, cocoa.  Drinks moderate in purines: Soft drinks and other drinks sweetened with high-fructose corn syrup. Juices. To find whether a food or drink is sweetened with high-fructose corn syrup,  look at the ingredients list.  Drinks high in purines: Alcoholic beverages (such as beer). Condiments  Foods low in purines: Salt, herbs, olives, pickles, relishes, vinegar.  Foods moderate in purines: Butter, margarine, oils, mayonnaise. Fats and Oils  Foods low in purines: All types, except gravies and sauces made with meat.  Foods high in purines: Gravies  and sauces made with meat. Other Foods  Foods low in purines: Sugars, sweets, gelatin. Cake. Soups made without meat.  Foods moderate in purines: Meat-based or fish-based soups, broths, or bouillons. Foods and drinks sweetened with high-fructose corn syrup.  Foods high in purines: High-fat desserts (such as ice cream, cookies, cakes, pies, doughnuts, and chocolate). Contact your dietitian for more information on foods that are not listed here.   This information is not intended to replace advice given to you by your health care provider. Make sure you discuss any questions you have with your health care provider.   Document Released: 06/07/2010 Document Revised: 02/15/2013 Document Reviewed: 01/17/2013 Elsevier Interactive Patient Education [employer].   Please be sure medication list is accurate. If a new problem present, please set up appointment sooner than planned today.

## 2019-06-02 ENCOUNTER — Encounter: Payer: Self-pay | Admitting: Family Medicine

## 2019-06-02 ENCOUNTER — Other Ambulatory Visit: Payer: Self-pay | Admitting: Family Medicine

## 2019-06-02 MED ORDER — ALLOPURINOL 100 MG PO TABS: 200.0000 mg | ORAL_TABLET | Freq: Every day | ORAL | 2 refills | Status: DC

## 2019-06-06 MED FILL — ALLOPURINOL 100 MG TABS: 100 | 90 days supply | Qty: 180 | Fill #0

## 2019-06-20 DIAGNOSIS — Z23 Encounter for immunization: Secondary | ICD-10-CM | POA: Diagnosis not present

## 2019-06-24 ENCOUNTER — Other Ambulatory Visit: Payer: Self-pay | Admitting: Family Medicine

## 2019-06-24 DIAGNOSIS — M109 Gout, unspecified: Secondary | ICD-10-CM

## 2019-06-24 MED FILL — COLCHICINE 0.6 MG TABS: 0.6 | 15 days supply | Qty: 30 | Fill #0

## 2019-07-31 IMAGING — MR MR CERVICAL SPINE W/O CM
5 series · 29 of 48 positions shown · non-contrast
Comparison: None.

CLINICAL DATA: Chronic injury which began after cleaning gutters.
No fall or injury. LEFT upper extremity symptoms.

EXAM:
MRI CERVICAL SPINE WITHOUT CONTRAST
TECHNIQUE: Multiplanar, multisequence MR imaging of the cervical spine was
performed. No intravenous contrast was administered.

[Series 3: T2 · sagittal · 3.0mm · 0.41mm/px · 7 of 15 slices shown (1 of 2)]
[im 1/15]
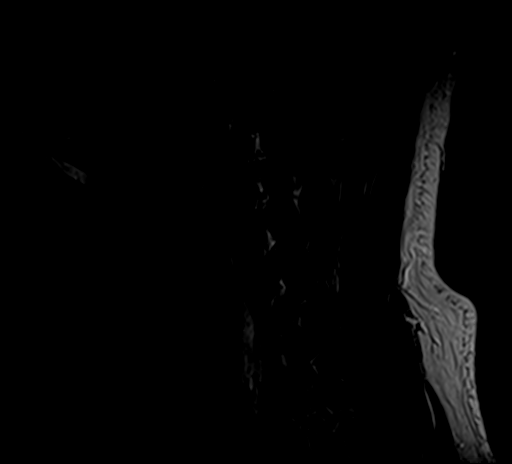
[im 3/15]
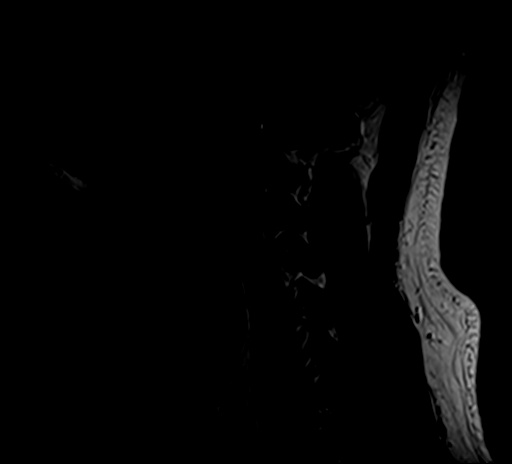
[im 5/15]
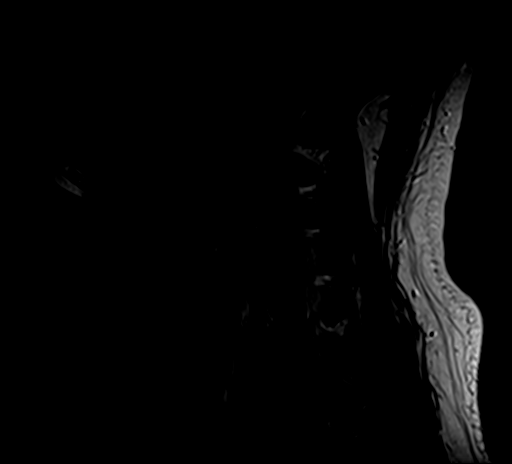
[im 8/15]
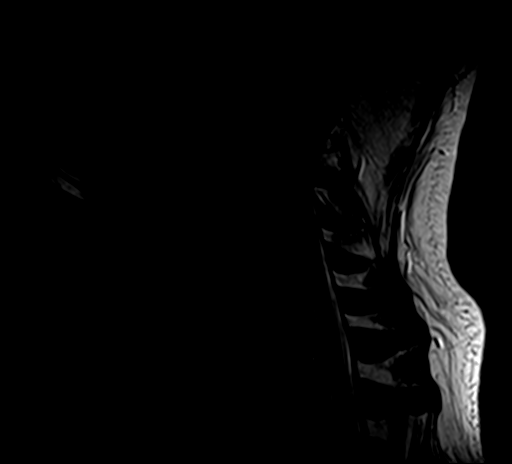
[im 10/15]
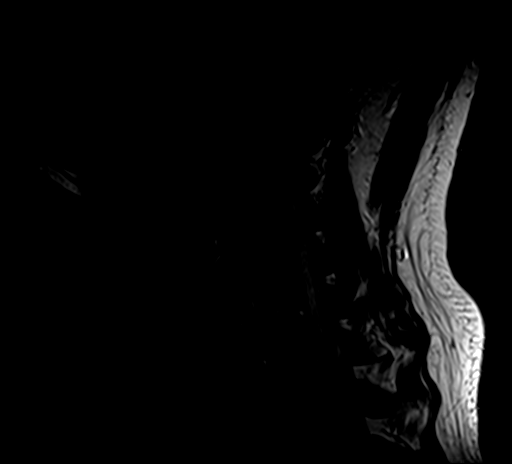
[im 12/15]
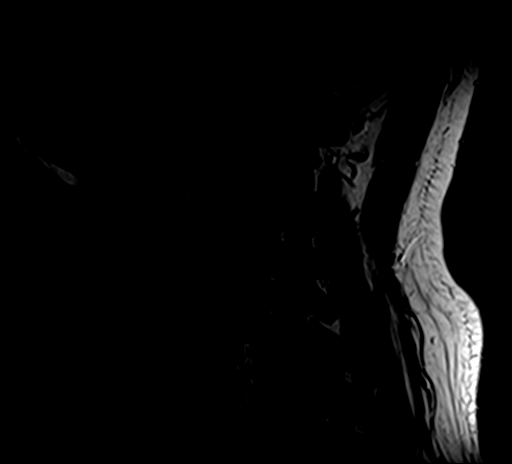
[im 15/15]
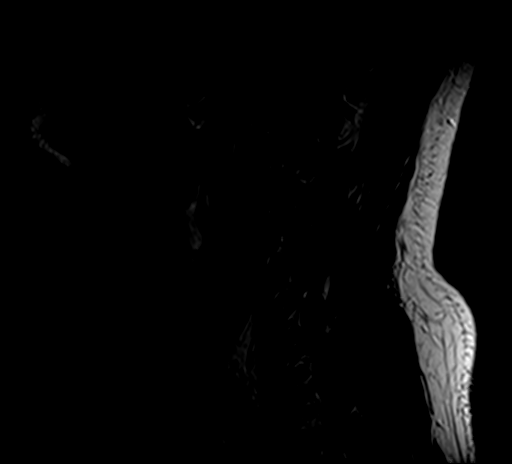

[Series 4: STIR · sagittal · 3.0mm · 0.82mm/px · 7 of 15 slices shown]
[im 1/15]
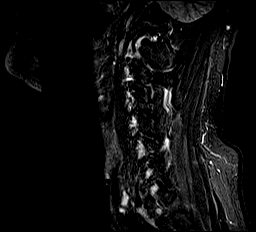
[im 3/15]
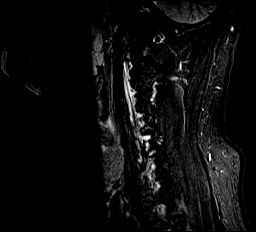
[im 5/15]
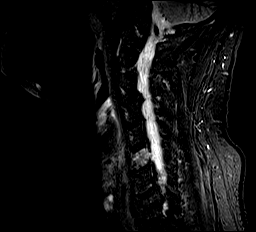
[im 8/15]
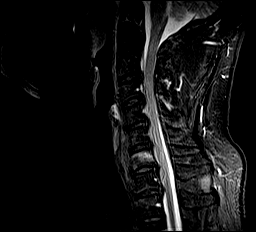
[im 10/15]
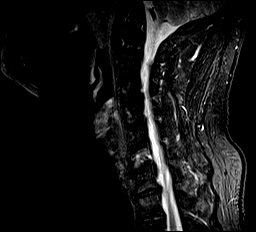
[im 12/15]
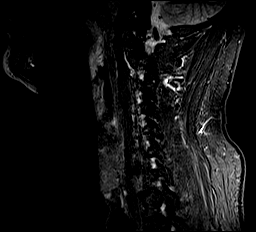
[im 15/15]
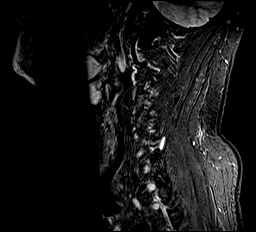

[Series 5: T1 · sagittal · 3.0mm · 0.41mm/px · 6 of 15 slices shown]
[im 1/15]
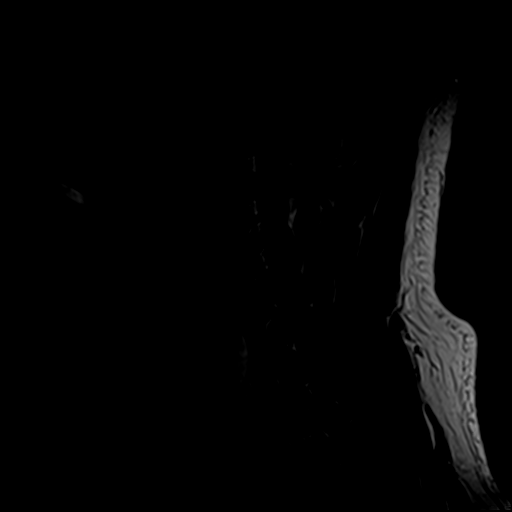
[im 3/15]
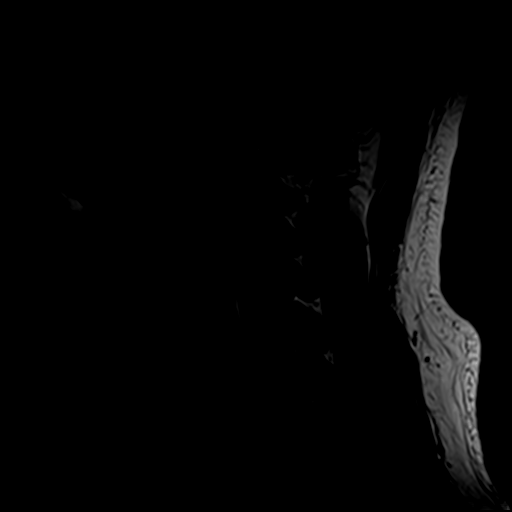
[im 6/15]
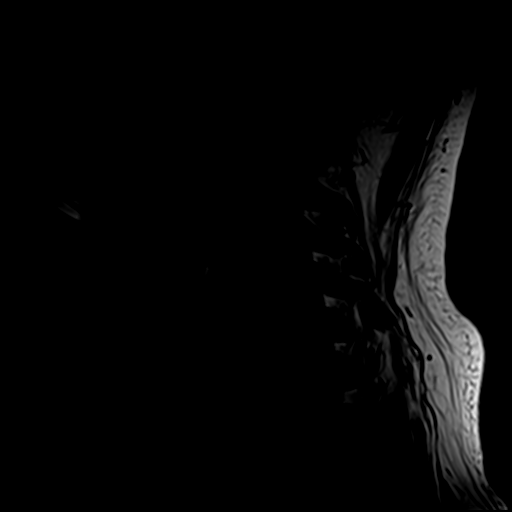
[im 9/15]
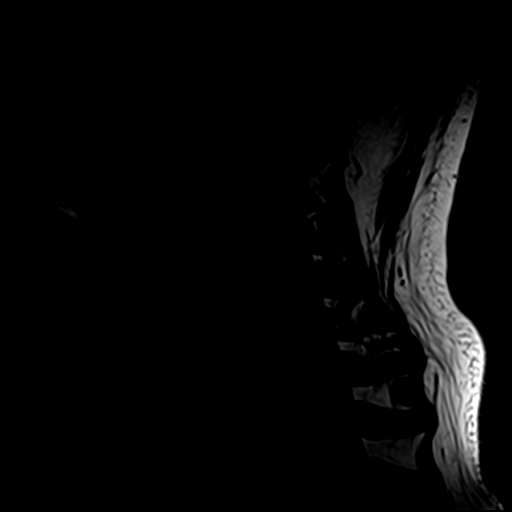
[im 12/15]
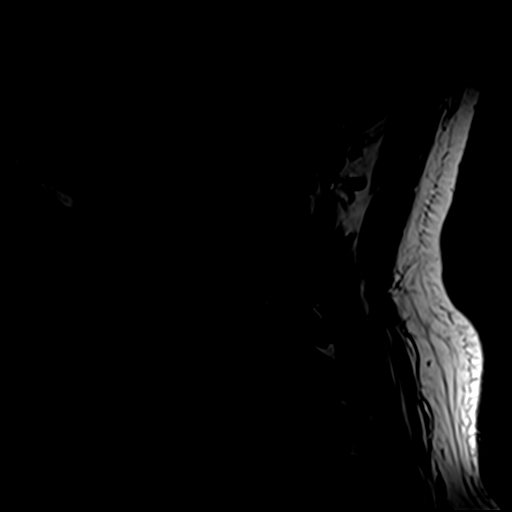
[im 15/15]
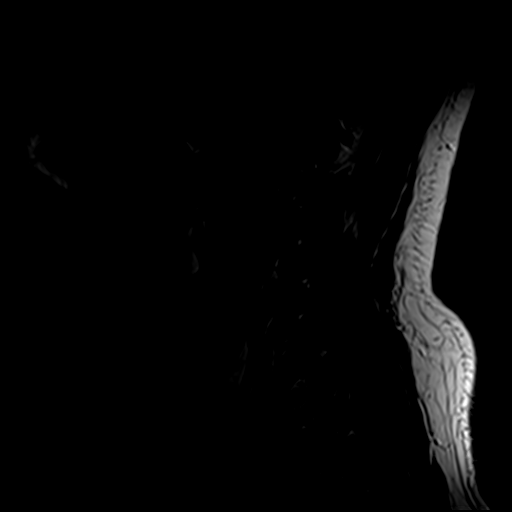

[Series 6: GRE · axial · 3.0mm · 0.35mm/px · 1 of 33 slices shown]
[im 1/33]
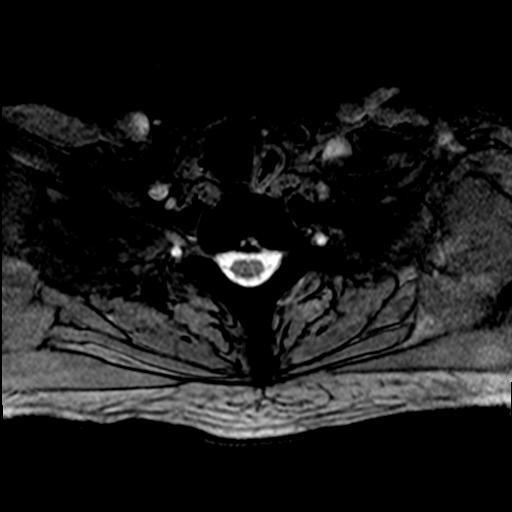

[Series 7: T2 · axial · 3.0mm · 0.70mm/px · z∈[-43,+71]mm · 8 of 33 slices shown (2 of 2)]
[im 1/33]
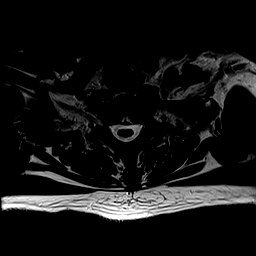
[im 5/33]
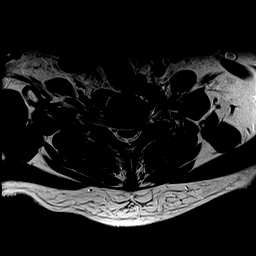
[im 10/33]
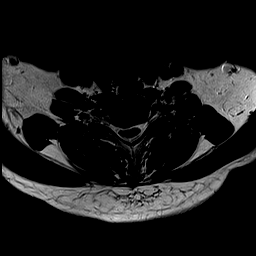
[im 15/33]
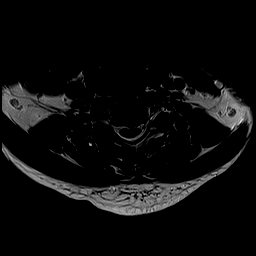
[im 18/33]
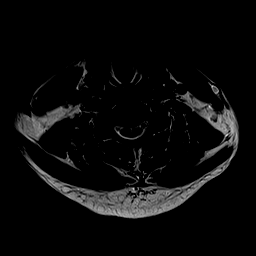
[im 23/33]
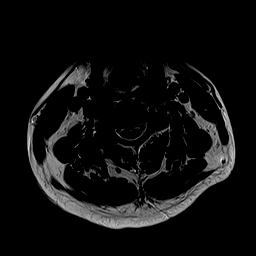
[im 28/33]
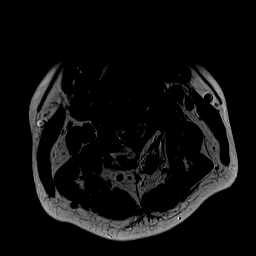
[im 33/33]
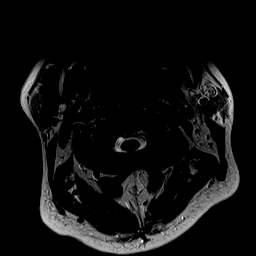

[29 of 48 positions shown; findings below may reference images not displayed]

FINDINGS: Alignment: Straightening of the normal cervical lordosis. No
subluxation.

Vertebrae: Abnormal C6 vertebral body, mildly heterogeneous T2 and
STIR hyperintense, T1 hypointense. This is not clearly a
posttraumatic abnormality. There is no loss of height or wedging.
Cortical margins are grossly intact. A primary bone lesion or
metastatic deposit could have this appearance. It is possible this
represents an atypical hemangioma, with a striated appearance on
axial imaging, although there is a paucity of fat. Prominent
esophagus anterior to C6 simulates prevertebral soft tissue
swelling.

Cord: Cord flattening at multiple levels, due to disc disease,
exacerbated by short pedicles, at C3-4, C4-5, C5-6, C6-7, and C7-T1.

Posterior Fossa, vertebral arteries, paraspinal tissues: No
tonsillar herniation. Flow voids are maintained in the vertebral
arteries. No neck masses. Query interspinous ligament edema and
C7-T1 see series 4 image 8.

Disc levels:

C2-3: Central and leftward extrusion. Slight cord flattening. LEFT
C3 foraminal narrowing.

C3-4: Broad-based central protrusion. Short pedicles. Stenosis with
cord flattening. BILATERAL C4 foraminal narrowing.

C4-5: Broad-based central protrusion. Cord flattening. Short
pedicles. No C5 foraminal narrowing.

C5-6: Central protrusion. Cord flattening. Short pedicles. No C6
foraminal narrowing.

C6-7: Central and rightward protrusion. RIGHT-sided cord flattening
short pedicles. RIGHT C7 foraminal narrowing.

C7-T1: Central and leftward extrusion extends into the foramen.
Slight cord flattening. Significant LEFT C8 nerve root compression.
IMPRESSION: The dominant LEFT-sided abnormality is at C7-T1 where a central and
leftward disc extrusion extends into the foramen. In the slight cord
flattening with significant LEFT C8 nerve root compression.

Multilevel disc disease elsewhere, in conjunction with short
pedicles, results in varying degrees of spinal stenosis and cord
flattening, without abnormal cord signal. This is most pronounced at
C4-C5.

Abnormal appearance to the C6 vertebral body is indeterminate. A
metastatic deposit could have this appearance, but there is no
history of cancer. This could represent a primary bone lesion.
Atypical hemangioma is less favored but not excluded. The appearance
is not clearly posttraumatic, and the patient does not have a
significant history of trauma. Consider further evaluation with
cervical spine CT scanning without contrast. Post infusion MRI due
imaging could also be performed, using fat saturation techniques.

These results will be called to the ordering clinician or
representative by the Radiologist Assistant, and communication
documented in the PACS or zVision Dashboard.

## 2019-08-30 MED FILL — ALLOPURINOL 100 MG TABS: 100 | 90 days supply | Qty: 180 | Fill #1

## 2019-11-25 MED FILL — ALLOPURINOL 100 MG TABS: 100 | 90 days supply | Qty: 180 | Fill #2

## 2019-12-02 ENCOUNTER — Encounter: Payer: 59 | Admitting: Family Medicine

## 2019-12-06 ENCOUNTER — Encounter: Payer: 59 | Admitting: Family Medicine

## 2019-12-13 ENCOUNTER — Ambulatory Visit: Payer: 59 | Admitting: Family Medicine

## 2019-12-13 ENCOUNTER — Encounter: Payer: Self-pay | Admitting: Family Medicine

## 2019-12-13 ENCOUNTER — Other Ambulatory Visit: Payer: Self-pay

## 2019-12-13 ENCOUNTER — Other Ambulatory Visit: Payer: Self-pay | Admitting: Family Medicine

## 2019-12-13 VITALS — BP 124/80 | HR 92 | Resp 16 | Ht 70.0 in | Wt 194.0 lb

## 2019-12-13 DIAGNOSIS — Z0000 Encounter for general adult medical examination without abnormal findings: Secondary | ICD-10-CM | POA: Diagnosis not present

## 2019-12-13 DIAGNOSIS — Z130 Encounter for screening for diseases of the blood and blood-forming organs and certain disorders involving the immune mechanism: Secondary | ICD-10-CM | POA: Diagnosis not present

## 2019-12-13 DIAGNOSIS — Z1329 Encounter for screening for other suspected endocrine disorder: Secondary | ICD-10-CM

## 2019-12-13 DIAGNOSIS — M109 Gout, unspecified: Secondary | ICD-10-CM

## 2019-12-13 DIAGNOSIS — Z1159 Encounter for screening for other viral diseases: Secondary | ICD-10-CM | POA: Diagnosis not present

## 2019-12-13 DIAGNOSIS — Z23 Encounter for immunization: Secondary | ICD-10-CM | POA: Diagnosis not present

## 2019-12-13 DIAGNOSIS — E785 Hyperlipidemia, unspecified: Secondary | ICD-10-CM | POA: Diagnosis not present

## 2019-12-13 DIAGNOSIS — Z125 Encounter for screening for malignant neoplasm of prostate: Secondary | ICD-10-CM

## 2019-12-13 DIAGNOSIS — Z13228 Encounter for screening for other metabolic disorders: Secondary | ICD-10-CM | POA: Diagnosis not present

## 2019-12-13 DIAGNOSIS — I10 Essential (primary) hypertension: Secondary | ICD-10-CM | POA: Diagnosis not present

## 2019-12-13 MED ORDER — GABAPENTIN 300 MG PO CAPS: 300.0000 mg | ORAL_CAPSULE | Freq: Three times a day (TID) | ORAL | 0 refills | Status: DC

## 2019-12-13 MED FILL — GABAPENTIN 300 MG CAPSULE: 300 | 5 days supply | Qty: 15 | Fill #0

## 2019-12-13 NOTE — Progress Notes (Signed)
HPI:  [name]. is a 54 y.o.male here today for his routine physical examination.  Last CPE: 11/30/2018. He lives with his wife and 2 sons.  Regular exercise 3 or more times per week: Tai Chi. 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 Following a healthy diet: Yes, plan based diet.  Chronic medical problems: HTN,HLD,gout,OSA.  Immunization History  Administered Date(s) Administered  . Influenza,inj,Quad PF,6+ Mos 11/23/2017, 11/30/2018, 12/13/2019  . Influenza-Unspecified 11/18/2016  . Moderna SARS-COVID-2 Vaccination 05/23/2019  . Tdap 12/13/2019   -Hep C screening: Never.  Last colon cancer screening: Cologuard negative on 12/02/17. Last prostate ca screening: PSA 2.5 on 11/23/17.  -Denies high alcohol intake, tobacco use, or Hx of illicit drug use.  -Concerns and/or follow up today:   A few days ago he had mild left-sided cervical pain, resolved. A couple years ago he had severe cervical pain with left-sided radicular pain. He underwent cervical epidural injection, which helped.  He is afraid of having pain again, so requesting Rx for Gabapentin that he can take in case he starts with neck pain again and while waiting to see ortho.  HTN: He is on Olmesartan-HCTZ 40-12.5 mg daily Home BP readings lower than reading today. Tolerating medication well. Negative for unusual headache,CP,or SOB.  Gout: He is on Allopurinol 100 mg daily and Colchicine 0.6 mg as needed. Flare ups are not frequent nor intense. He would like to have uric acid check.  HLD: On non pharmacologic treatment.  Component     Latest Ref Rng & Units 11/30/2018  Cholesterol     <200 mg/dL 195  Triglycerides     <150 mg/dL 106.0  HDL Cholesterol     > OR = 40 mg/dL 52.50  VLDL     0.0 - 40.0 mg/dL 21.2  LDL (calc)     0 - 99 mg/dL 121 (H)  Total CHOL/HDL Ratio     <5.0 (calc) 4  NonHDL       142.15   Review of Systems  Constitutional: Negative for activity change, appetite change, fatigue and fever.  HENT: Negative for dental problem, mouth sores, nosebleeds and sore throat.   Eyes: Negative for redness and visual disturbance.  Respiratory: Negative for cough and wheezing.   Cardiovascular: Negative for palpitations and leg swelling.  Gastrointestinal: Negative for abdominal pain, blood in stool, nausea and vomiting.  Endocrine: Negative for cold intolerance, heat intolerance, polydipsia, polyphagia and polyuria.  Genitourinary: Negative for decreased urine volume, dysuria, genital sores, hematuria and testicular pain.  Musculoskeletal: Positive for arthralgias. Negative for myalgias.  Skin: Negative for color change and rash.  Allergic/Immunologic: Positive for environmental allergies.  Neurological: Negative for syncope, facial asymmetry and weakness.  Hematological: Negative for adenopathy. Does not bruise/bleed easily.  Psychiatric/Behavioral: Negative for confusion and sleep disturbance. The patient is not nervous/anxious.   All other systems reviewed and are negative.  Current Outpatient Medications on File Prior to Visit  Medication Sig Dispense Refill  . acetaminophen (TYLENOL) 500 MG tablet Take 500 mg by mouth every 6 (six) hours as needed.    Marland Kitchen allopurinol (ZYLOPRIM) 100 MG tablet Take 2 tablets (200 mg total) by mouth daily. 180 tablet 2  . colchicine 0.6 MG tablet TAKE 1 TABLET (0.6 MG TOTAL) BY MOUTH TWICE A DAY AS NEEDED. 30 tablet 1  . olmesartan-hydrochlorothiazide (BENICAR HCT) 40-12.5 MG tablet TAKE 1 TABLET BY MOUTH  DAILY. 90 tablet 1   No current facility-administered medications on file prior to visit.   Past Medical History:  Diagnosis Date  . Arthritis    Knee OA  . Hypertension     Past Surgical History:  Procedure Laterality Date  . CHOLECYSTECTOMY  2003    No Known Allergies  Family History  Problem Relation Age of Onset  . Stroke  Mother        aneurysm  . Hypertension Mother   . Hypertension Father     Social History   Socioeconomic History  . Marital status: Married    Spouse name: Not on file  . Number of children: Not on file  . Years of education: Not on file  . Highest education level: Not on file  Occupational History  . Not on file  Tobacco Use  . Smoking status: Never Smoker  . Smokeless tobacco: Never Used  Substance and Sexual Activity  . Alcohol use: Yes    Comment: Occasional  . Drug use: No  . Sexual activity: Not on file  Other Topics Concern  . Not on file  Social History Narrative  . Not on file   Social Determinants of Health   Financial Resource Strain:   . Difficulty of Paying Living Expenses: Not on file  Food Insecurity:   . Worried About Charity fundraiser in the Last Year: Not on file  . Ran Out of Food in the Last Year: Not on file  Transportation Needs:   . Lack of Transportation (Medical): Not on file  . Lack of Transportation (Non-Medical): Not on file  Physical Activity:   . Days of Exercise per Week: Not on file  . Minutes of Exercise per Session: Not on file  Stress:   . Feeling of Stress : Not on file  Social Connections:   . Frequency of Communication with Friends and Family: Not on file  . Frequency of Social Gatherings with Friends and Family: Not on file  . Attends Religious Services: Not on file  . Active Member of Clubs or Organizations: Not on file  . Attends Archivist Meetings: Not on file  . Marital Status: Not on file   Vitals:   12/13/19 0947  BP: 124/80  Pulse: 92  Resp: 16  SpO2: 99%   Body mass index is 27.84 kg/m.  Wt Readings from Last 3 Encounters:  12/13/19 194 lb (88 kg)  05/30/19 195 lb (88.5 kg)  11/30/18 200 lb (90.7 kg)   Physical Exam Vitals and nursing note reviewed.  Constitutional:      General: He is not in acute distress.    Appearance: He is well-developed.  HENT:     Head: Normocephalic and  atraumatic.     Right Ear: Tympanic membrane, ear canal and external ear normal.     Left Ear: Tympanic membrane, ear canal and external ear normal.     Mouth/Throat:     Mouth: Mucous membranes are moist.     Pharynx: Oropharynx is clear. Uvula midline.  Eyes:     Extraocular Movements: Extraocular movements intact.     Conjunctiva/sclera: Conjunctivae normal.     Pupils: Pupils are equal, round, and reactive to light.  Neck:     Thyroid: No thyromegaly.     Trachea: No tracheal deviation.  Cardiovascular:     Rate and Rhythm: Normal rate and regular rhythm.     Pulses:  Dorsalis pedis pulses are 2+ on the right side and 2+ on the left side.     Heart sounds: No murmur heard.   Pulmonary:     Effort: Pulmonary effort is normal. No respiratory distress.     Breath sounds: Normal breath sounds.  Abdominal:     Palpations: Abdomen is soft. There is no hepatomegaly or mass.     Tenderness: There is no abdominal tenderness.  Genitourinary:    Comments: No concerns. Musculoskeletal:        General: No tenderness.     Cervical back: Normal range of motion.     Comments: No major deformities appreciated and no signs of synovitis.  Lymphadenopathy:     Cervical: No cervical adenopathy.     Upper Body:     Right upper body: No supraclavicular adenopathy.     Left upper body: No supraclavicular adenopathy.  Skin:    General: Skin is warm.     Findings: No erythema.  Neurological:     General: No focal deficit present.     Mental Status: He is alert and oriented to person, place, and time.     Cranial Nerves: No cranial nerve deficit.     Sensory: No sensory deficit.     Coordination: Coordination normal.     Gait: Gait normal.     Deep Tendon Reflexes:     Reflex Scores:      Bicep reflexes are 2+ on the right side and 2+ on the left side.      Patellar reflexes are 2+ on the right side and 2+ on the left side.  ASSESSMENT AND PLAN:  Mr.Dessie was seen today for  annual exam.  Diagnoses and all orders for this visit: Orders Placed This Encounter  Procedures  . Flu Vaccine QUAD 36+ mos IM  . Tdap vaccine greater than or equal to 7yo IM  . Hepatitis C antibody  . COMPLETE METABOLIC PANEL WITH GFR  . Lipid panel  . Uric acid  . PSA   Lab Results  Component Value Date   LABURIC 7.2 12/13/2019   Lab Results  Component Value Date   CHOL 206 (H) 12/13/2019   HDL 56 12/13/2019   LDLCALC 126 (H) 12/13/2019   TRIG 129 12/13/2019   CHOLHDL 3.7 12/13/2019   Lab Results  Component Value Date   CREATININE 1.18 12/13/2019   BUN 14 12/13/2019   NA 138 12/13/2019   K 5.3 12/13/2019   CL 99 12/13/2019   CO2 28 12/13/2019   Lab Results  Component Value Date   ALT 36 12/13/2019   AST 25 12/13/2019   ALKPHOS 79 11/30/2018   BILITOT 0.9 12/13/2019   Routine general medical examination at a health care facility Encouraged consistency with following healthful diet and low impact regular physical activity. Preventive guidelines reviewed. Vaccination updated.  Next CPE in a year.  The 10-year ASCVD risk score Mikey Bussing DC Brooke Bonito., et al., 2013) is: 4.3%   Values used to calculate the score:     Age: 44 years     Sex: Male     Is Non-Hispanic African American: No     Diabetic: No     Tobacco smoker: No     Systolic Blood Pressure: 638 mmHg     Is BP treated: Yes     HDL Cholesterol: 56 mg/dL     Total Cholesterol: 206 mg/dL  Need for influenza vaccination -     Flu Vaccine QUAD 36+  mos IM  Hypertension, essential, benign BP adequately controlled. Continue Olmesartan-HCTZ 40-12.5 mg daily. Continue monitoring BP regularly.  Hyperlipidemia, unspecified hyperlipidemia type Continue non pharmacologic treatment. Further recommendations according to FLP results.  Encounter for HCV screening test for low risk patient -     Hepatitis C antibody  Screening for endocrine, metabolic and immunity disorder -     COMPLETE METABOLIC PANEL WITH  GFR  Gouty arthritis of both ankles Better controlled. No changes in Allopurinol dose.  Prostate cancer screening -     PSA  Need for Tdap vaccination -     Tdap vaccine greater than or equal to 7yo IM  Other orders Rx for Gabapentin 300 mg to start once daily if cervical pain and radiculopathy present again. Strongly recommend avoiding activities that could increase the risk of injury.  -     gabapentin (NEURONTIN) 300 MG capsule; Take 1 capsule (300 mg total) by mouth 3 (three) times daily.  Return in about 6 months (around 06/12/2020).   Lyrique Hakim G. Martinique, MD  [facility]. [place] office.  A few things to remember from today's visit:  Routine general medical examination at a health care facility  Need for influenza vaccination - Plan: Flu Vaccine QUAD 36+ mos IM  Hypertension, essential, benign  Hyperlipidemia, unspecified hyperlipidemia type - Plan: Lipid panel  Encounter for HCV screening test for low risk patient - Plan: Hepatitis C antibody  Screening for endocrine, metabolic and immunity disorder - Plan: COMPLETE METABOLIC PANEL WITH GFR  Gouty arthritis of both ankles - Plan: Uric acid  Gabapentin sent to start if radicular pain starts again.   If you need refills please call your pharmacy. Do not use My Chart to request refills or for acute issues that need immediate attention.   Please be sure medication list is accurate. If a new problem present, please set up appointment sooner than planned today.  At least 150 minutes of moderate exercise per week, daily brisk walking for 15-30 min is a good exercise option. Healthy diet low in saturated (animal) fats and sweets and consisting of fresh fruits and vegetables, lean meats such as fish and white chicken and whole grains.  - Vaccines:  Tdap vaccine every 10 years.  Shingles vaccine recommended at age 60, could be given after 52 years of age but not sure about insurance coverage.  Pneumonia  vaccines: Pneumovax at 21  -Screening recommendations for low/normal risk males:  Screening for diabetes at age 24 and every 3 years. Earlier screening if cardiovascular risk factors.  Lipid screening at 35 and every 3 years. Screening starts in younger males with cardiovascular risk factors.N/A  Colon cancer screening is now at age 58 but your insurance may not cover until age 10 .screening is recommended age 57.  Prostate cancer screening: some controversy, starts usually at 75: Rectal exam and PSA.  Aortic Abdominal Aneurism once between 63 and 63 years old if ever smoker.  Also recommended:  1. Dental visit- Brush and floss your teeth twice daily; visit your dentist twice a year. 2. Eye doctor- Get an eye exam at least every 2 years. 3. Helmet use- Always wear a helmet when riding a bicycle, motorcycle, rollerblading or skateboarding. 4. Safe sex- If you may be exposed to sexually transmitted infections, use a condom. 5. Seat belts- Seat belts can save your live; always wear one. 6. Smoke/Carbon Monoxide detectors- These detectors need to be installed on the appropriate level of your home. Replace batteries at  least once a year. 7. Skin cancer- When out in the sun please cover up and use sunscreen 15 SPF or higher. 8. Violence- If anyone is threatening or hurting you, please tell your healthcare provider.  9. Drink alcohol in moderation- Limit alcohol intake to one drink or less per day. Never drink and drive.

## 2019-12-13 NOTE — Patient Instructions (Addendum)
A few things to remember from today's visit:  Routine general medical examination at a health care facility  Need for influenza vaccination - Plan: Flu Vaccine QUAD 36+ mos IM  Hypertension, essential, benign  Hyperlipidemia, unspecified hyperlipidemia type - Plan: Lipid panel  Encounter for HCV screening test for low risk patient - Plan: Hepatitis C antibody  Screening for endocrine, metabolic and immunity disorder - Plan: COMPLETE METABOLIC PANEL WITH GFR  Gouty arthritis of both ankles - Plan: Uric acid  Gabapentin sent to start if radicular pain starts again.   If you need refills please call your pharmacy. Do not use My Chart to request refills or for acute issues that need immediate attention.   Please be sure medication list is accurate. If a new problem present, please set up appointment sooner than planned today.  At least 150 minutes of moderate exercise per week, daily brisk walking for 15-30 min is a good exercise option. Healthy diet low in saturated (animal) fats and sweets and consisting of fresh fruits and vegetables, lean meats such as fish and white chicken and whole grains.  - Vaccines:  Tdap vaccine every 10 years.  Shingles vaccine recommended at age 30, could be given after 52 years of age but not sure about insurance coverage.  Pneumonia vaccines: Pneumovax at 56  -Screening recommendations for low/normal risk males:  Screening for diabetes at age 38 and every 3 years. Earlier screening if cardiovascular risk factors.  Lipid screening at 35 and every 3 years. Screening starts in younger males with cardiovascular risk factors.N/A  Colon cancer screening is now at age 30 but your insurance may not cover until age 75 .screening is recommended age 68.  Prostate cancer screening: some controversy, starts usually at 31: Rectal exam and PSA.  Aortic Abdominal Aneurism once between 8 and 56 years old if ever smoker.  Also recommended:  1. Dental visit-  Brush and floss your teeth twice daily; visit your dentist twice a year. 2. Eye doctor- Get an eye exam at least every 2 years. 3. Helmet use- Always wear a helmet when riding a bicycle, motorcycle, rollerblading or skateboarding. 4. Safe sex- If you may be exposed to sexually transmitted infections, use a condom. 5. Seat belts- Seat belts can save your live; always wear one. 6. Smoke/Carbon Monoxide detectors- These detectors need to be installed on the appropriate level of your home. Replace batteries at least once a year. 7. Skin cancer- When out in the sun please cover up and use sunscreen 15 SPF or higher. 8. Violence- If anyone is threatening or hurting you, please tell your healthcare provider.  9. Drink alcohol in moderation- Limit alcohol intake to one drink or less per day. Never drink and drive.

## 2019-12-14 LAB — LIPID PANEL
Cholesterol: 206 mg/dL — ABNORMAL HIGH (ref <200)
HDL: 56 mg/dL (ref >=40)
LDL Cholesterol (Calc): 126 mg/dL (calc) — ABNORMAL HIGH
Non-HDL Cholesterol (Calc): 150 mg/dL (calc) — ABNORMAL HIGH (ref <130)
Total CHOL/HDL Ratio: 3.7 (calc) (ref <5.0)
Triglycerides: 129 mg/dL (ref <150)

## 2019-12-14 LAB — COMPLETE METABOLIC PANEL WITH GFR
AG Ratio: 1.7 (calc) (ref 1.0–2.5)
ALT: 36 U/L (ref 9–46)
AST: 25 U/L (ref 10–35)
Albumin: 4.9 g/dL (ref 3.6–5.1)
Alkaline phosphatase (APISO): 76 U/L (ref 35–144)
BUN: 14 mg/dL (ref 7–25)
CO2: 28 mmol/L (ref 20–32)
Calcium: 10.5 mg/dL — ABNORMAL HIGH (ref 8.6–10.3)
Chloride: 99 mmol/L (ref 98–110)
Creat: 1.18 mg/dL (ref 0.70–1.33)
GFR, Est African American: 82 mL/min/{1.73_m2} (ref >=60)
GFR, Est Non African American: 71 mL/min/{1.73_m2} (ref >=60)
Globulin: 2.9 g/dL (calc) (ref 1.9–3.7)
Glucose, Bld: 92 mg/dL (ref 65–99)
Potassium: 5.3 mmol/L (ref 3.5–5.3)
Sodium: 138 mmol/L (ref 135–146)
Total Bilirubin: 0.9 mg/dL (ref 0.2–1.2)
Total Protein: 7.8 g/dL (ref 6.1–8.1)

## 2019-12-14 LAB — URIC ACID: Uric Acid, Serum: 7.2 mg/dL (ref 4.0–8.0)

## 2019-12-14 LAB — HEPATITIS C ANTIBODY
Hepatitis C Ab: NONREACTIVE
SIGNAL TO CUT-OFF: 0.04 (ref <1.00)

## 2019-12-14 LAB — PSA: PSA: 2.57 ng/mL (ref <=4.0)

## 2019-12-15 ENCOUNTER — Other Ambulatory Visit: Payer: Self-pay | Admitting: Family Medicine

## 2019-12-15 MED ORDER — OLMESARTAN MEDOXOMIL-HCTZ 40-12.5 MG PO TABS: 1.0000 | ORAL_TABLET | Freq: Every day | ORAL | 2 refills | Status: DC

## 2019-12-15 MED ORDER — ALLOPURINOL 100 MG PO TABS: 200.0000 mg | ORAL_TABLET | Freq: Every day | ORAL | 3 refills | Status: DC

## 2019-12-16 MED FILL — OLMESARTAN-HCTZ 40-12.5 MG: 40-12.5 | 90 days supply | Qty: 90 | Fill #0

## 2019-12-18 ENCOUNTER — Encounter: Payer: Self-pay | Admitting: Family Medicine

## 2020-02-01 MED FILL — OLMESARTAN-HCTZ 40-12.5 MG: 40-12.5 | 90 days supply | Qty: 90 | Fill #0

## 2020-02-27 MED FILL — ALLOPURINOL 100 MG TABS: 100 | 90 days supply | Qty: 180 | Fill #0

## 2020-02-27 MED FILL — COLCHICINE 0.6 MG TABS: 0.6 | 15 days supply | Qty: 30 | Fill #1

## 2020-05-27 ENCOUNTER — Other Ambulatory Visit (HOSPITAL_COMMUNITY): Payer: Self-pay

## 2020-05-28 ENCOUNTER — Other Ambulatory Visit (HOSPITAL_COMMUNITY): Payer: Self-pay

## 2020-05-28 DIAGNOSIS — I10 Essential (primary) hypertension: Secondary | ICD-10-CM | POA: Diagnosis not present

## 2020-05-28 DIAGNOSIS — H5203 Hypermetropia, bilateral: Secondary | ICD-10-CM | POA: Diagnosis not present

## 2020-05-28 MED ORDER — ALLOPURINOL 100 MG PO TABS
ORAL_TABLET | ORAL | 3 refills | Status: DC
  Filled 2020-05-28 – 2020-08-29 (×2): qty 180, 90d supply, fill #0
  Filled 2020-11-22: qty 180, 90d supply, fill #1

## 2020-05-28 MED FILL — Allopurinol Tab 100 MG: ORAL | 90 days supply | Qty: 180 | Fill #0 | Status: AC

## 2020-05-29 ENCOUNTER — Other Ambulatory Visit (HOSPITAL_COMMUNITY): Payer: Self-pay

## 2020-06-12 ENCOUNTER — Other Ambulatory Visit: Payer: Self-pay | Admitting: Family Medicine

## 2020-06-12 DIAGNOSIS — M109 Gout, unspecified: Secondary | ICD-10-CM

## 2020-06-12 MED FILL — Olmesartan Medoxomil-Hydrochlorothiazide Tab 40-12.5 MG: ORAL | 90 days supply | Qty: 90 | Fill #0 | Status: AC

## 2020-06-13 ENCOUNTER — Other Ambulatory Visit (HOSPITAL_COMMUNITY): Payer: Self-pay

## 2020-06-13 MED ORDER — COLCHICINE 0.6 MG PO TABS
0.6000 mg | ORAL_TABLET | Freq: Two times a day (BID) | ORAL | 1 refills | Status: DC | PRN
  Filled 2020-06-13: qty 30, 15d supply, fill #0
  Filled 2021-02-24: qty 30, 15d supply, fill #1

## 2020-06-28 ENCOUNTER — Other Ambulatory Visit (HOSPITAL_BASED_OUTPATIENT_CLINIC_OR_DEPARTMENT_OTHER): Payer: Self-pay

## 2020-07-03 ENCOUNTER — Ambulatory Visit: Payer: 59 | Attending: Internal Medicine

## 2020-07-03 DIAGNOSIS — Z20822 Contact with and (suspected) exposure to covid-19: Secondary | ICD-10-CM

## 2020-07-04 ENCOUNTER — Other Ambulatory Visit: Payer: 59

## 2020-07-04 LAB — NOVEL CORONAVIRUS, NAA: SARS-CoV-2, NAA: NOT DETECTED

## 2020-07-04 LAB — SARS-COV-2, NAA 2 DAY TAT

## 2020-07-09 ENCOUNTER — Encounter: Payer: Self-pay | Admitting: Family Medicine

## 2020-07-24 ENCOUNTER — Telehealth: Payer: Self-pay | Admitting: Physical Medicine and Rehabilitation

## 2020-07-24 NOTE — Telephone Encounter (Signed)
Scheduled for office visit on 6/15 at 1000.

## 2020-07-24 NOTE — Telephone Encounter (Signed)
Patient's son Erik Rubio called needing to schedule an appointment for his father for his upper back. The number to contact Erik Rubio is [phone]

## 2020-08-07 ENCOUNTER — Encounter: Payer: Self-pay | Admitting: Physical Medicine and Rehabilitation

## 2020-08-07 ENCOUNTER — Ambulatory Visit: Payer: 59 | Admitting: Physical Medicine and Rehabilitation

## 2020-08-07 ENCOUNTER — Other Ambulatory Visit: Payer: Self-pay

## 2020-08-07 ENCOUNTER — Telehealth: Payer: Self-pay | Admitting: Physical Medicine and Rehabilitation

## 2020-08-07 VITALS — BP 144/89 | HR 99

## 2020-08-07 DIAGNOSIS — M4312 Spondylolisthesis, cervical region: Secondary | ICD-10-CM | POA: Diagnosis not present

## 2020-08-07 DIAGNOSIS — M4802 Spinal stenosis, cervical region: Secondary | ICD-10-CM | POA: Diagnosis not present

## 2020-08-07 DIAGNOSIS — M542 Cervicalgia: Secondary | ICD-10-CM | POA: Diagnosis not present

## 2020-08-07 NOTE — Telephone Encounter (Signed)
Needs auth for left C7-T1 [place]. Scheduled for 6/16.

## 2020-08-07 NOTE — Progress Notes (Signed)
Erik Rubio. - 55 y.o. male MRN [id number]  Date of birth: 01-Jan-1966  Office Visit Note: Visit Date: 08/07/2020 PCP: Martinique, Betty G, MD Referred by: Martinique, Betty G, MD  Subjective: Chief Complaint  Patient presents with   Neck - Pain   Left Shoulder - Pain   HPI: Erik Rubio. is a 55 y.o. male who comes in today for evaluation and management of chronic, worsening and severe left sided neck pain radiating to left shoulder.Pt is having acute exacerbation of chronic symptoms today as he reports symptoms began during trip to [place] 1 month ago after lifting and moving heavy luggage. Pt describes pain as burning sensation and rates 2 out of 10 at present. Pt states pain worsens with movement and lifting. Pt continues massage therapy at home, also taking Lyrica and Prednisone with some relief. Pt had C7-T1 interlaminar epidural steroid injection in 2018 with 80% relief for 2 weeks and sustained good relief after that.  He reports that his current pain is very similar to what he had at that time.  MRI of the cervical spine was performed in 2018 and that can be reviewed below.  This basically showed disc herniation extrusion at the lower part of the cervical spine which really fit with the pain pattern that he was having.  He had no real history of myofascial pain or trigger points.  Pt denies focal weakness, numbness and tingling.    Review of Systems  Musculoskeletal:  Positive for joint pain and neck pain.  Neurological:  Negative for tingling and focal weakness.  All other systems reviewed and are negative. Otherwise per HPI.  Assessment & Plan: Visit Diagnoses:    ICD-10-CM   1. Cervicalgia  M54.2     2. Spondylolisthesis of cervical region  M43.12     3. Spinal stenosis of cervical region  M48.02        Plan: Findings:  Chronic, severe and worsening left sided neck pain radiating to left shoulder. MRI exhibits significant disc herniations  throughout cervical spine with leftward extrusion and slight cord flattening at C7-T1. Pt reports 80% pain relief from interlaminar C7-T1 ESI in 2018.  Given that his pain can be exacerbated with movement to a point where it does affect his daily living and is very similar to what he had in 2018 I think the neck step is to perform diagnostic and hopefully therapeutic C7-T1 interlaminar epidural steroid injection. Pt will continue on Lyrica until after injection, then will re-evaluate for tapering if not needed for pain relief. Plan for possible repeat MRI and physical therapy discussed with patient today if injection does not help with pain.    Meds & Orders: No orders of the defined types were placed in this encounter.  No orders of the defined types were placed in this encounter.   Follow-up: Return in about 1 week (around 08/14/2020) for C7-T1 interlaminar ESI.   Procedures: No procedures performed      Clinical History: MRI CERVICAL SPINE WITHOUT CONTRAST   TECHNIQUE: Multiplanar, multisequence MR imaging of the cervical spine was performed. No intravenous contrast was administered.   COMPARISON:  None.   FINDINGS: Alignment: Straightening of the normal cervical lordosis. No subluxation.   Vertebrae: Abnormal C6 vertebral body, mildly heterogeneous T2 and STIR hyperintense, T1 hypointense. This is not clearly a posttraumatic abnormality. There is no loss of height or wedging. Cortical margins are grossly intact. A primary bone lesion or metastatic deposit could have this  appearance. It is possible this represents an atypical hemangioma, with a striated appearance on axial imaging, although there is a paucity of fat. Prominent esophagus anterior to C6 simulates prevertebral soft tissue swelling.   Cord: Cord flattening at multiple levels, due to disc disease, exacerbated by short pedicles, at C3-4, C4-5, C5-6, C6-7, and C7-T1.   Posterior Fossa, vertebral arteries, paraspinal  tissues: No tonsillar herniation. Flow voids are maintained in the vertebral arteries. No neck masses. Query interspinous ligament edema and C7-T1 see series 4 image 8.   Disc levels:   C2-3: Central and leftward extrusion. Slight cord flattening. LEFT C3 foraminal narrowing.   C3-4: Broad-based central protrusion. Short pedicles. Stenosis with cord flattening. BILATERAL C4 foraminal narrowing.   C4-5: Broad-based central protrusion. Cord flattening. Short pedicles. No C5 foraminal narrowing.   C5-6: Central protrusion. Cord flattening. Short pedicles. No C6 foraminal narrowing.   C6-7: Central and rightward protrusion. RIGHT-sided cord flattening short pedicles. RIGHT C7 foraminal narrowing.   C7-T1: Central and leftward extrusion extends into the foramen. Slight cord flattening. Significant LEFT C8 nerve root compression.   IMPRESSION: The dominant LEFT-sided abnormality is at C7-T1 where a central and leftward disc extrusion extends into the foramen. In the slight cord flattening with significant LEFT C8 nerve root compression.   Multilevel disc disease elsewhere, in conjunction with short pedicles, results in varying degrees of spinal stenosis and cord flattening, without abnormal cord signal. This is most pronounced at C4-C5.   Abnormal appearance to the C6 vertebral body is indeterminate. A metastatic deposit could have this appearance, but there is no history of cancer. This could represent a primary bone lesion. Atypical hemangioma is less favored but not excluded. The appearance is not clearly posttraumatic, and the patient does not have a significant history of trauma. Consider further evaluation with cervical spine CT scanning without contrast. Post infusion MRI due imaging could also be performed, using fat saturation techniques.   These results will be called to the ordering clinician or representative by the Radiologist Assistant, and  communication documented in the PACS or zVision Dashboard.     Electronically Signed   By: Staci Righter M.D.   On: 12/23/2016 16:47   He reports that he has never smoked. He has never used smokeless tobacco.  Recent Labs    12/13/19 1033  LABURIC 7.2    Objective:  VS:  HT:    WT:   BMI:     BP:(!) 144/89  HR:99bpm  TEMP: ( )  RESP:  Physical Exam HENT:     Head: Normocephalic.     Right Ear: Tympanic membrane normal.     Left Ear: Tympanic membrane normal.     Nose: Nose normal.     Mouth/Throat:     Mouth: Mucous membranes are moist.  Eyes:     Pupils: Pupils are equal, round, and reactive to light.  Cardiovascular:     Rate and Rhythm: Normal rate and regular rhythm.     Pulses: Normal pulses.     Heart sounds: Normal heart sounds.  Pulmonary:     Effort: Pulmonary effort is normal.  Abdominal:     General: Abdomen is flat. There is no distension.  Musculoskeletal:     Cervical back: Normal range of motion. Tenderness present.     Comments: Pt standing with forward flexed cervical spine. Pain noted to left neck upon flexion and extension of cervical spine. Tenderness noted upon palpation of small trigger point to left trapezius. Good  strength noted to bilateral upper extremities. Negative Hoffman's sign.   Skin:    General: Skin is warm and dry.  Neurological:     General: No focal deficit present.     Mental Status: He is alert and oriented to person, place, and time.     Sensory: No sensory deficit.     Motor: No weakness.     Gait: Gait normal.  Psychiatric:        Mood and Affect: Mood normal.    Ortho Exam  Imaging: No results found.  Past Medical/Family/Surgical/Social History: Medications & Allergies reviewed per EMR, new medications updated. Patient Active Problem List   Diagnosis Date Noted   Hyperlipidemia 11/21/2016   OSA (obstructive sleep apnea) 06/03/2016   Hypertension, essential, benign 06/03/2016   Past Medical History:   Diagnosis Date   Arthritis    Knee OA   Hypertension    Family History  Problem Relation Age of Onset   Stroke Mother        aneurysm   Hypertension Mother    Hypertension Father    Past Surgical History:  Procedure Laterality Date   CHOLECYSTECTOMY  2003   Social History   Occupational History   Not on file  Tobacco Use   Smoking status: Never   Smokeless tobacco: Never  Substance and Sexual Activity   Alcohol use: Yes    Comment: Occasional   Drug use: No   Sexual activity: Not on file

## 2020-08-07 NOTE — Progress Notes (Signed)
Upper back/ neck pain radiating to left shoulder. Finished a short course of prednisone which helped relieve some of the pain. Feels pressure on top of left shoulder. Past injection relieved pain for about 2 years. States that pain is still not as bad as before and does not radiate as far.  Numeric Pain Rating Scale and Functional Assessment Average Pain 2 Pain Right Now 2 My pain is constant, dull, and aching Pain is worse with: some activites Pain improves with: medication   In the last MONTH (on 0-10 scale) has pain interfered with the following?  1. General activity like being  able to carry out your everyday physical activities such as walking, climbing stairs, carrying groceries, or moving a chair?  Rating(3)  2. Relation with others like being able to carry out your usual social activities and roles such as  activities at home, at work and in your community. Rating(3)  3. Enjoyment of life such that you have  been bothered by emotional problems such as feeling anxious, depressed or irritable?  Rating(2)

## 2020-08-09 ENCOUNTER — Other Ambulatory Visit: Payer: Self-pay

## 2020-08-09 ENCOUNTER — Ambulatory Visit: Payer: Self-pay

## 2020-08-09 ENCOUNTER — Encounter: Payer: Self-pay | Admitting: Physical Medicine and Rehabilitation

## 2020-08-09 ENCOUNTER — Ambulatory Visit: Payer: 59 | Admitting: Physical Medicine and Rehabilitation

## 2020-08-09 VITALS — BP 149/101 | HR 102

## 2020-08-09 DIAGNOSIS — M4802 Spinal stenosis, cervical region: Secondary | ICD-10-CM

## 2020-08-09 MED ORDER — BETAMETHASONE SOD PHOS & ACET 6 (3-3) MG/ML IJ SUSP
12.0000 mg | Freq: Once | INTRAMUSCULAR | Status: AC
  Administered 2020-08-09: 12 mg

## 2020-08-09 NOTE — Progress Notes (Signed)
Erik Rubio Arif Amendola. - 55 y.o. male MRN [id number]  Date of birth: 1966/02/19  Office Visit Note: Visit Date: 08/09/2020 PCP: Martinique, Betty G, MD Referred by: Martinique, Betty G, MD  Subjective: Chief Complaint  Patient presents with   Neck - Pain   Left Shoulder - Pain   HPI:  Erik Rubio Antwone Capozzoli. is a 55 y.o. male who comes in today for planned Left C7-T1 Cervical Interlaminar epidural steroid injection with fluoroscopic guidance.  The patient has failed conservative care including home exercise, medications, time and activity modification.  This injection will be diagnostic and hopefully therapeutic.  Please see requesting physician notes for further details and justification.   ROS Otherwise per HPI.  Assessment & Plan: Visit Diagnoses:    ICD-10-CM   1. Spinal stenosis of cervical region  M48.02 XR C-ARM NO REPORT    Epidural Steroid injection    betamethasone acetate-betamethasone sodium phosphate (CELESTONE) injection 12 mg      Plan: No additional findings.   Meds & Orders:  Meds ordered this encounter  Medications   betamethasone acetate-betamethasone sodium phosphate (CELESTONE) injection 12 mg    Orders Placed This Encounter  Procedures   XR C-ARM NO REPORT   Epidural Steroid injection    Follow-up: No follow-ups on file.   Procedures: No procedures performed  Cervical Epidural Steroid Injection - Interlaminar Approach with Fluoroscopic Guidance  Patient: Erik Rubio.      Date of Birth: 05-17-1965 MRN: [id number] PCP: Martinique, Betty G, MD      Visit Date: 08/09/2020   Universal Protocol:    Date/Time: 06/16/224:17 PM  Consent Given By: the patient  Position: PRONE  Additional Comments: Vital signs were monitored before and after the procedure. Patient was prepped and draped in the usual sterile fashion. The correct patient, procedure, and site was verified.   Injection Procedure Details:   Procedure diagnoses:  Spinal stenosis of cervical region [M48.02]    Meds Administered:  Meds ordered this encounter  Medications   betamethasone acetate-betamethasone sodium phosphate (CELESTONE) injection 12 mg     Laterality: Left  Location/Site: C7-T1  Needle: 3.5 in., 20 ga. Tuohy  Needle Placement: Paramedian epidural space  Findings:  -Comments: Excellent flow of contrast into the epidural space.  Procedure Details: Using a paramedian approach from the side mentioned above, the region overlying the inferior lamina was localized under fluoroscopic visualization and the soft tissues overlying this structure were infiltrated with 4 ml. of 1% Lidocaine without Epinephrine. A # 20 gauge, Tuohy needle was inserted into the epidural space using a paramedian approach.  The epidural space was localized using loss of resistance along with contralateral oblique bi-planar fluoroscopic views.  After negative aspirate for air, blood, and CSF, a 2 ml. volume of Isovue-250 was injected into the epidural space and the flow of contrast was observed. Radiographs were obtained for documentation purposes.   The injectate was administered into the level noted above.  Additional Comments:  The patient tolerated the procedure well Dressing: 2 x 2 sterile gauze and Band-Aid    Post-procedure details: Patient was observed during the procedure. Post-procedure instructions were reviewed.  Patient left the clinic in stable condition.    Clinical History: MRI CERVICAL SPINE WITHOUT CONTRAST   TECHNIQUE: Multiplanar, multisequence MR imaging of the cervical spine was performed. No intravenous contrast was administered.   COMPARISON:  None.   FINDINGS: Alignment: Straightening of the normal cervical lordosis. No subluxation.   Vertebrae: Abnormal C6  vertebral body, mildly heterogeneous T2 and STIR hyperintense, T1 hypointense. This is not clearly a posttraumatic abnormality. There is no loss of height or  wedging. Cortical margins are grossly intact. A primary bone lesion or metastatic deposit could have this appearance. It is possible this represents an atypical hemangioma, with a striated appearance on axial imaging, although there is a paucity of fat. Prominent esophagus anterior to C6 simulates prevertebral soft tissue swelling.   Cord: Cord flattening at multiple levels, due to disc disease, exacerbated by short pedicles, at C3-4, C4-5, C5-6, C6-7, and C7-T1.   Posterior Fossa, vertebral arteries, paraspinal tissues: No tonsillar herniation. Flow voids are maintained in the vertebral arteries. No neck masses. Query interspinous ligament edema and C7-T1 see series 4 image 8.   Disc levels:   C2-3: Central and leftward extrusion. Slight cord flattening. LEFT C3 foraminal narrowing.   C3-4: Broad-based central protrusion. Short pedicles. Stenosis with cord flattening. BILATERAL C4 foraminal narrowing.   C4-5: Broad-based central protrusion. Cord flattening. Short pedicles. No C5 foraminal narrowing.   C5-6: Central protrusion. Cord flattening. Short pedicles. No C6 foraminal narrowing.   C6-7: Central and rightward protrusion. RIGHT-sided cord flattening short pedicles. RIGHT C7 foraminal narrowing.   C7-T1: Central and leftward extrusion extends into the foramen. Slight cord flattening. Significant LEFT C8 nerve root compression.   IMPRESSION: The dominant LEFT-sided abnormality is at C7-T1 where a central and leftward disc extrusion extends into the foramen. In the slight cord flattening with significant LEFT C8 nerve root compression.   Multilevel disc disease elsewhere, in conjunction with short pedicles, results in varying degrees of spinal stenosis and cord flattening, without abnormal cord signal. This is most pronounced at C4-C5.   Abnormal appearance to the C6 vertebral body is indeterminate. A metastatic deposit could have this appearance, but there is  no history of cancer. This could represent a primary bone lesion. Atypical hemangioma is less favored but not excluded. The appearance is not clearly posttraumatic, and the patient does not have a significant history of trauma. Consider further evaluation with cervical spine CT scanning without contrast. Post infusion MRI due imaging could also be performed, using fat saturation techniques.   These results will be called to the ordering clinician or representative by the Radiologist Assistant, and communication documented in the PACS or zVision Dashboard.     Electronically Signed   By: Staci Righter M.D.   On: 12/23/2016 16:47     Objective:  VS:  HT:    WT:   BMI:     BP:(!) 149/101  HR:(!) 102bpm  TEMP: ( )  RESP:  Physical Exam Vitals and nursing note reviewed.  Constitutional:      General: He is not in acute distress.    Appearance: Normal appearance. He is not ill-appearing.  HENT:     Head: Normocephalic and atraumatic.     Right Ear: External ear normal.     Left Ear: External ear normal.  Eyes:     Extraocular Movements: Extraocular movements intact.  Cardiovascular:     Rate and Rhythm: Normal rate.     Pulses: Normal pulses.  Abdominal:     General: There is no distension.     Palpations: Abdomen is soft.  Musculoskeletal:        General: No signs of injury.     Cervical back: Neck supple. Tenderness present. No rigidity.     Right lower leg: No edema.     Left lower leg: No edema.  Comments: Patient has good strength in the upper extremities with 5 out of 5 strength in wrist extension long finger flexion APB.  No intrinsic hand muscle atrophy.  Negative Hoffmann's test.  Lymphadenopathy:     Cervical: No cervical adenopathy.  Skin:    Findings: No erythema or rash.  Neurological:     General: No focal deficit present.     Mental Status: He is alert and oriented to person, place, and time.     Sensory: No sensory deficit.     Motor: No weakness  or abnormal muscle tone.     Coordination: Coordination normal.  Psychiatric:        Mood and Affect: Mood normal.        Behavior: Behavior normal.     Imaging: XR C-ARM NO REPORT  Result Date: 08/09/2020 Please see Notes tab for imaging impression.

## 2020-08-09 NOTE — Procedures (Signed)
Cervical Epidural Steroid Injection - Interlaminar Approach with Fluoroscopic Guidance  Patient: Erik Rubio.      Date of Birth: September 16, 1965 MRN: [id number] PCP: Martinique, Betty G, MD      Visit Date: 08/09/2020   Universal Protocol:    Date/Time: 06/16/224:17 PM  Consent Given By: the patient  Position: PRONE  Additional Comments: Vital signs were monitored before and after the procedure. Patient was prepped and draped in the usual sterile fashion. The correct patient, procedure, and site was verified.   Injection Procedure Details:   Procedure diagnoses: Spinal stenosis of cervical region [M48.02]    Meds Administered:  Meds ordered this encounter  Medications   betamethasone acetate-betamethasone sodium phosphate (CELESTONE) injection 12 mg     Laterality: Left  Location/Site: C7-T1  Needle: 3.5 in., 20 ga. Tuohy  Needle Placement: Paramedian epidural space  Findings:  -Comments: Excellent flow of contrast into the epidural space.  Procedure Details: Using a paramedian approach from the side mentioned above, the region overlying the inferior lamina was localized under fluoroscopic visualization and the soft tissues overlying this structure were infiltrated with 4 ml. of 1% Lidocaine without Epinephrine. A # 20 gauge, Tuohy needle was inserted into the epidural space using a paramedian approach.  The epidural space was localized using loss of resistance along with contralateral oblique bi-planar fluoroscopic views.  After negative aspirate for air, blood, and CSF, a 2 ml. volume of Isovue-250 was injected into the epidural space and the flow of contrast was observed. Radiographs were obtained for documentation purposes.   The injectate was administered into the level noted above.  Additional Comments:  The patient tolerated the procedure well Dressing: 2 x 2 sterile gauze and Band-Aid    Post-procedure details: Patient was observed during the  procedure. Post-procedure instructions were reviewed.  Patient left the clinic in stable condition.

## 2020-08-09 NOTE — Patient Instructions (Signed)

## 2020-08-09 NOTE — Progress Notes (Signed)
Pt state neck pain that travels down his left shoulder. Pt state when turning his head or lifting items makes the pain worse. Pt state he takes pain meds to help ease his pain.  Numeric Pain Rating Scale and Functional Assessment Average Pain 2   In the last MONTH (on 0-10 scale) has pain interfered with the following?  1. General activity like being  able to carry out your everyday physical activities such as walking, climbing stairs, carrying groceries, or moving a chair?  Rating(9)   +Driver, -BT, -Dye Allergies.

## 2020-08-30 ENCOUNTER — Other Ambulatory Visit (HOSPITAL_COMMUNITY): Payer: Self-pay

## 2020-11-22 ENCOUNTER — Other Ambulatory Visit (HOSPITAL_COMMUNITY): Payer: Self-pay

## 2020-12-17 NOTE — Progress Notes (Signed)
HPI: [name]. is a 55 y.o.male here today for his routine physical examination.  Last CPE: 12/13/19 He lives with his wife.   Regular exercise 3 or more times per week: Tai Chi. 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 Following a healthy diet: Yes, plan based diet.Home made meals.  Chronic medical problems: HLD,HTN,cervical radiculopathy among some. He is following with ortho, received cervical epidural injection and has helped. He was on prednisone and Lyrica for a few months and gained some wt.  Immunization History  Administered Date(s) Administered   Influenza,inj,Quad PF,6+ Mos 11/23/2017, 11/30/2018, 12/13/2019, 12/18/2020   Influenza-Unspecified 11/18/2016   Moderna SARS-COV2 Booster Vaccination 06/20/2019, 01/24/2020, 06/04/2020   Moderna Sars-Covid-2 Vaccination 05/23/2019   Tdap 12/13/2019   Hep C screening: 12/13/19 NR Last colon cancer screening: never done Last prostate ca screening: 12/13/19 normal Nocturia x 1.  Negative for high alcohol intake, tobacco use, or Hx of illicit drug use.  Concerns and/or follow up today:   HLD: He is not on pharmacologic treatment. Lab Results  Component Value Date   CHOL 206 (H) 12/13/2019   HDL 56 12/13/2019   LDLCALC 126 (H) 12/13/2019   TRIG 129 12/13/2019   CHOLHDL 3.7 12/13/2019   Hyperuricemia/gout: He is on Allopurinol 100 mg 2 tabs daily. He has not had a gout attack in a while.  Lab Results  Component Value Date   LABURIC 7.2 12/13/2019   HTN: He is on Benicar-HCT 40-12.5 mg daily. Home BP readings: < 120's/70-80's. Negative for CP, dyspnea, focal neurologic deficit. Tolerated medication well.  Lab Results  Component Value Date   CREATININE 1.18 12/13/2019   BUN 14 12/13/2019   NA 138 12/13/2019   K 5.3 12/13/2019   CL 99 12/13/2019   CO2 28 12/13/2019   ED: For the past year having difficulty  with maintaining erections. No deformities or genital lesions. He would like to try Sildenafil.  He would like vit D checked. No hx of vit D deficiency. Last year Ca++ was mildly elevated at 10.5 (11/2019).  Review of Systems  Constitutional:  Negative for activity change, appetite change, chills and fever.  HENT:  Negative for nosebleeds, sore throat and trouble swallowing.   Eyes:  Negative for redness and visual disturbance.  Respiratory:  Negative for cough, shortness of breath and wheezing.   Cardiovascular:  Negative for chest pain, palpitations and leg swelling.  Gastrointestinal:  Negative for abdominal pain, blood in stool, nausea and vomiting.  Endocrine: Negative for cold intolerance, heat intolerance, polydipsia, polyphagia and polyuria.  Genitourinary:  Negative for decreased urine volume, dysuria, genital sores, hematuria, penile discharge, penile pain and testicular pain.  Musculoskeletal:  Positive for arthralgias. Negative for gait problem and myalgias.  Skin:  Negative for color change and rash.  Allergic/Immunologic: Negative for environmental allergies.  Neurological:  Negative for dizziness, syncope, weakness and headaches.  Hematological:  Negative for adenopathy. Does not bruise/bleed easily.  Psychiatric/Behavioral:  Negative for confusion. The patient is not nervous/anxious.   All other systems reviewed and are negative.  Current Outpatient Medications on File Prior to Visit  Medication Sig Dispense Refill   allopurinol (ZYLOPRIM) 100 MG tablet Take 2 tablets by mouth daily 180 tablet 3   colchicine 0.6 MG tablet Take 1 tablet (0.6 mg total) by mouth 2 (two) times daily as needed. 30 tablet 1   olmesartan-hydrochlorothiazide (BENICAR HCT) 40-12.5 MG  tablet TAKE 1 TABLET BY MOUTH DAILY. 90 tablet 2   No current facility-administered medications on file prior to visit.   Past Medical History:  Diagnosis Date   Arthritis    Knee OA   Hypertension    Past  Surgical History:  Procedure Laterality Date   CHOLECYSTECTOMY  2003   No Known Allergies  Family History  Problem Relation Age of Onset   Stroke Mother        aneurysm   Hypertension Mother    Hypertension Father    Social History   Socioeconomic History   Marital status: Married    Spouse name: Not on file   Number of children: Not on file   Years of education: Not on file   Highest education level: Not on file  Occupational History   Not on file  Tobacco Use   Smoking status: Never   Smokeless tobacco: Never  Substance and Sexual Activity   Alcohol use: Yes    Comment: Occasional   Drug use: No   Sexual activity: Not on file  Other Topics Concern   Not on file  Social History Narrative   Not on file   Social Determinants of Health   Financial Resource Strain: Not on file  Food Insecurity: Not on file  Transportation Needs: Not on file  Physical Activity: Not on file  Stress: Not on file  Social Connections: Not on file   Vitals:   12/18/20 0752  BP: 124/70  Pulse: 92  Resp: 16  SpO2: 98%   Body mass index is 29.06 kg/m. Wt Readings from Last 3 Encounters:  12/18/20 202 lb 8 oz (91.9 kg)  12/13/19 194 lb (88 kg)  05/30/19 195 lb (88.5 kg)   Physical Exam Vitals and nursing note reviewed.  Constitutional:      General: He is not in acute distress.    Appearance: He is well-developed.  HENT:     Head: Normocephalic and atraumatic.     Right Ear: Tympanic membrane, ear canal and external ear normal.     Left Ear: Tympanic membrane, ear canal and external ear normal.     Mouth/Throat:     Mouth: Mucous membranes are moist.     Pharynx: Oropharynx is clear.  Eyes:     Extraocular Movements: Extraocular movements intact.     Conjunctiva/sclera: Conjunctivae normal.     Pupils: Pupils are equal, round, and reactive to light.  Neck:     Thyroid: No thyroid mass.     Trachea: No tracheal deviation.  Cardiovascular:     Rate and Rhythm: Normal  rate and regular rhythm.     Pulses:          Dorsalis pedis pulses are 2+ on the right side and 2+ on the left side.     Heart sounds: No murmur heard. Pulmonary:     Effort: Pulmonary effort is normal. No respiratory distress.     Breath sounds: Normal breath sounds.  Abdominal:     Palpations: Abdomen is soft. There is no hepatomegaly or mass.     Tenderness: There is no abdominal tenderness.  Genitourinary:    Comments: No concerns. Musculoskeletal:        General: No tenderness.     Cervical back: Normal range of motion.     Comments: No signs of synovitis.  Lymphadenopathy:     Cervical: No cervical adenopathy.     Upper Body:     Right upper body:  No supraclavicular adenopathy.     Left upper body: No supraclavicular adenopathy.  Skin:    General: Skin is warm.     Findings: No erythema.  Neurological:     General: No focal deficit present.     Mental Status: He is alert and oriented to person, place, and time.     Cranial Nerves: No cranial nerve deficit.     Sensory: No sensory deficit.     Motor: No weakness or tremor.     Gait: Gait normal.     Deep Tendon Reflexes:     Reflex Scores:      Bicep reflexes are 2+ on the right side and 2+ on the left side.      Patellar reflexes are 2+ on the right side and 2+ on the left side. Psychiatric:        Mood and Affect: Mood and affect normal.   ASSESSMENT AND PLAN:  Mr.Chaseton was seen today for annual exam and follow-up.  Diagnoses and all orders for this visit: Orders Placed This Encounter  Procedures   Flu Vaccine QUAD 70moIM (Fluarix, Fluzone & Alfiuria Quad PF)   Microalbumin / creatinine urine ratio   VITAMIN D 25 Hydroxy (Vit-D Deficiency, Fractures)   Calcium, ionized   Parathyroid hormone, intact (no Ca)   TSH   Uric acid   Cologuard   Comprehensive metabolic panel   Hemoglobin A1c   PSA(Must document that pt has been informed of limitations of PSA testing.)   Lipid panel   Lab Results   Component Value Date   LABURIC 7.2 12/18/2020   Lab Results  Component Value Date   HGBA1C 5.6 12/18/2020   Lab Results  Component Value Date   TSH 0.94 12/18/2020   Lab Results  Component Value Date   CREATININE 1.29 12/18/2020   BUN 19 12/18/2020   NA 137 12/18/2020   K 4.5 12/18/2020   CL 98 12/18/2020   CO2 30 12/18/2020   Lab Results  Component Value Date   ALT 52 12/18/2020   AST 31 12/18/2020   ALKPHOS 83 12/18/2020   BILITOT 0.6 12/18/2020   Lab Results  Component Value Date   PSA 2.87 12/18/2020   Lab Results  Component Value Date   CHOL 226 (H) 12/18/2020   HDL 58.80 12/18/2020   LDLCALC 129 (H) 12/18/2020   TRIG 193.0 (H) 12/18/2020   CHOLHDL 4 12/18/2020   Routine general medical examination at a health care facility We discussed the importance of regular physical activity and healthy diet for prevention of chronic illness and/or complications. Preventive guidelines reviewed. Vaccination up-to-date. Next CPE in a year. The 10-year ASCVD risk score (Arnett DK, et al., 2019) is: 4.4%   Values used to calculate the score:     Age: [age]years     Sex: Male     Is Non-Hispanic African American: No     Diabetic: No     Tobacco smoker: No     Systolic Blood Pressure: 1161mmHg     Is BP treated: No     HDL Cholesterol: 58.8 mg/dL     Total Cholesterol: 226 mg/dL  Need for influenza vaccination -     Flu Vaccine QUAD 620moM (Fluarix, Fluzone & Alfiuria Quad PF)  Hypercalcemia Mild. We discussed possible etiologies. Further recommendation will be given according to lab results.  Prostate cancer screening -     PSA(Must document that pt has been informed of limitations of PSA testing.)  Colon cancer screening -     Cologuard  Screening for endocrine, metabolic and immunity disorder -     Hemoglobin A1c -     Comprehensive metabolic panel  Hypertension, essential, benign BP adequately controlled. Continue current management: Benicar-HCT  40-12.5 mg daily. DASH/low salt diet to continue. Continue monitoring BP at home.  Hyperlipidemia Non pharmacologic treatment recommended for now. Further recommendations will be given according to 10 years CVD risk score and lipid panel numbers.  Erectile dysfunction We discussed pharmacologic treatment options and side effects. Sildenafil 20 mg 2 tabs daily prn.  Gouty arthritis of both ankles Problem is well controlled. Continue allopurinol 100 mg 2 tablets daily. Continue avoiding foods that could exacerbate problem.  Return in 6 months (on 06/18/2021) for HTN.   Reinhart Saulters G. Martinique, MD  [facility]. [place] office.

## 2020-12-18 ENCOUNTER — Other Ambulatory Visit: Payer: Self-pay

## 2020-12-18 ENCOUNTER — Other Ambulatory Visit (HOSPITAL_COMMUNITY): Payer: Self-pay

## 2020-12-18 ENCOUNTER — Encounter: Payer: Self-pay | Admitting: Family Medicine

## 2020-12-18 ENCOUNTER — Telehealth: Payer: Self-pay | Admitting: Family Medicine

## 2020-12-18 ENCOUNTER — Ambulatory Visit (INDEPENDENT_AMBULATORY_CARE_PROVIDER_SITE_OTHER): Payer: 59 | Admitting: Family Medicine

## 2020-12-18 VITALS — BP 124/70 | HR 92 | Resp 16 | Ht 70.0 in | Wt 202.5 lb

## 2020-12-18 DIAGNOSIS — Z0000 Encounter for general adult medical examination without abnormal findings: Secondary | ICD-10-CM | POA: Diagnosis not present

## 2020-12-18 DIAGNOSIS — Z1329 Encounter for screening for other suspected endocrine disorder: Secondary | ICD-10-CM | POA: Diagnosis not present

## 2020-12-18 DIAGNOSIS — I10 Essential (primary) hypertension: Secondary | ICD-10-CM | POA: Diagnosis not present

## 2020-12-18 DIAGNOSIS — N529 Male erectile dysfunction, unspecified: Secondary | ICD-10-CM | POA: Diagnosis not present

## 2020-12-18 DIAGNOSIS — Z1211 Encounter for screening for malignant neoplasm of colon: Secondary | ICD-10-CM

## 2020-12-18 DIAGNOSIS — M109 Gout, unspecified: Secondary | ICD-10-CM | POA: Insufficient documentation

## 2020-12-18 DIAGNOSIS — Z130 Encounter for screening for diseases of the blood and blood-forming organs and certain disorders involving the immune mechanism: Secondary | ICD-10-CM | POA: Diagnosis not present

## 2020-12-18 DIAGNOSIS — Z13228 Encounter for screening for other metabolic disorders: Secondary | ICD-10-CM | POA: Diagnosis not present

## 2020-12-18 DIAGNOSIS — Z23 Encounter for immunization: Secondary | ICD-10-CM | POA: Diagnosis not present

## 2020-12-18 DIAGNOSIS — E785 Hyperlipidemia, unspecified: Secondary | ICD-10-CM | POA: Diagnosis not present

## 2020-12-18 DIAGNOSIS — Z125 Encounter for screening for malignant neoplasm of prostate: Secondary | ICD-10-CM

## 2020-12-18 DIAGNOSIS — M5410 Radiculopathy, site unspecified: Secondary | ICD-10-CM | POA: Insufficient documentation

## 2020-12-18 LAB — LIPID PANEL
Cholesterol: 226 mg/dL — ABNORMAL HIGH (ref 0–200)
HDL: 58.8 mg/dL (ref >39.00)
LDL Cholesterol: 129 mg/dL — ABNORMAL HIGH (ref 0–99)
NonHDL: 167.56
Total CHOL/HDL Ratio: 4
Triglycerides: 193 mg/dL — ABNORMAL HIGH (ref 0.0–149.0)
VLDL: 38.6 mg/dL (ref 0.0–40.0)

## 2020-12-18 LAB — MICROALBUMIN / CREATININE URINE RATIO
Creatinine,U: 40.6 mg/dL
Microalb Creat Ratio: 1.7 mg/g (ref 0.0–30.0)
Microalb, Ur: <0.7 mg/dL (ref 0.0–1.9)

## 2020-12-18 LAB — COMPREHENSIVE METABOLIC PANEL
ALT: 52 U/L (ref 0–53)
AST: 31 U/L (ref 0–37)
Albumin: 5 g/dL (ref 3.5–5.2)
Alkaline Phosphatase: 83 U/L (ref 39–117)
BUN: 19 mg/dL (ref 6–23)
CO2: 30 mEq/L (ref 19–32)
Calcium: 10.7 mg/dL — ABNORMAL HIGH (ref 8.4–10.5)
Chloride: 98 mEq/L (ref 96–112)
Creatinine, Ser: 1.29 mg/dL (ref 0.40–1.50)
GFR: 63.51 mL/min (ref >60.00)
Glucose, Bld: 100 mg/dL — ABNORMAL HIGH (ref 70–99)
Potassium: 4.5 mEq/L (ref 3.5–5.1)
Sodium: 137 mEq/L (ref 135–145)
Total Bilirubin: 0.6 mg/dL (ref 0.2–1.2)
Total Protein: 7.9 g/dL (ref 6.0–8.3)

## 2020-12-18 LAB — PSA: PSA: 2.87 ng/mL (ref 0.10–4.00)

## 2020-12-18 LAB — VITAMIN D 25 HYDROXY (VIT D DEFICIENCY, FRACTURES): VITD: 49.31 ng/mL (ref 30.00–100.00)

## 2020-12-18 LAB — HEMOGLOBIN A1C: Hgb A1c MFr Bld: 5.6 % (ref 4.6–6.5)

## 2020-12-18 LAB — TSH: TSH: 0.94 u[IU]/mL (ref 0.35–5.50)

## 2020-12-18 LAB — URIC ACID: Uric Acid, Serum: 7.2 mg/dL (ref 4.0–7.8)

## 2020-12-18 MED ORDER — LOVASTATIN 20 MG PO TABS
20.0000 mg | ORAL_TABLET | Freq: Every day | ORAL | 3 refills | Status: DC
  Filled 2020-12-18: qty 30, 30d supply, fill #0

## 2020-12-18 MED ORDER — SILDENAFIL CITRATE 20 MG PO TABS
40.0000 mg | ORAL_TABLET | Freq: Every day | ORAL | 1 refills | Status: DC | PRN
  Filled 2020-12-18: qty 60, 30d supply, fill #0
  Filled 2021-08-19: qty 60, 30d supply, fill #1

## 2020-12-18 NOTE — Addendum Note (Signed)
Addended by: Rodrigo Ran on: 12/18/2020 02:13 PM   Modules accepted: Orders

## 2020-12-18 NOTE — Telephone Encounter (Signed)
Patient wanted to talk to Dr. Martinique again about his son. He states that they discussed it during his visit but he needed to speak to her again about it.  Informed patient that a message could be sent back and someone would reach back out to him.  Patient would like a call at [phone].  Please advise.

## 2020-12-18 NOTE — Assessment & Plan Note (Signed)
Problem is well controlled. Continue allopurinol 100 mg 2 tablets daily. Continue avoiding foods that could exacerbate problem.

## 2020-12-18 NOTE — Telephone Encounter (Signed)
I called and spoke with patient. He is aware that Dr. Martinique will see his son again.

## 2020-12-18 NOTE — Patient Instructions (Addendum)
A few things to remember from today's visit:  Routine general medical examination at a health care facility  Hyperlipidemia, unspecified hyperlipidemia type - Plan: Lipid panel  Hypertension, essential, benign - Plan: Microalbumin / creatinine urine ratio  Gouty arthritis of both ankles - Plan: Uric acid  Need for influenza vaccination - Plan: Flu Vaccine QUAD 22moIM (Fluarix, Fluzone & Alfiuria Quad PF)  Hypercalcemia - Plan: VITAMIN D 25 Hydroxy (Vit-D Deficiency, Fractures), Calcium, ionized, Parathyroid hormone, intact (no Ca)  Erectile dysfunction, unspecified erectile dysfunction type - Plan: TSH  Prostate cancer screening - Plan: PSA(Must document that pt has been informed of limitations of PSA testing.)  Colon cancer screening - Plan: Cologuard  Screening for endocrine, metabolic and immunity disorder - Plan: Comprehensive metabolic panel, Hemoglobin A1c  If you need refills please call your pharmacy. Do not use My Chart to request refills or for acute issues that need immediate attention.   Please be sure medication list is accurate. If a new problem present, please set up appointment sooner than planned today.  Health Maintenance, Male Adopting a healthy lifestyle and getting preventive care are important in promoting health and wellness. Ask your health care provider about: The right schedule for you to have regular tests and exams. Things you can do on your own to prevent diseases and keep yourself healthy. What should I know about diet, weight, and exercise? Eat a healthy diet  Eat a diet that includes plenty of vegetables, fruits, low-fat dairy products, and lean protein. Do not eat a lot of foods that are high in solid fats, added sugars, or sodium. Maintain a healthy weight Body mass index (BMI) is a measurement that can be used to identify possible weight problems. It estimates body fat based on height and weight. Your health care provider can help determine  your BMI and help you achieve or maintain a healthy weight. Get regular exercise Get regular exercise. This is one of the most important things you can do for your health. Most adults should: Exercise for at least 150 minutes each week. The exercise should increase your heart rate and make you sweat (moderate-intensity exercise). Do strengthening exercises at least twice a week. This is in addition to the moderate-intensity exercise. Spend less time sitting. Even light physical activity can be beneficial. Watch cholesterol and blood lipids Have your blood tested for lipids and cholesterol at 53years of age, then have this test every 5 years. You may need to have your cholesterol levels checked more often if: Your lipid or cholesterol levels are high. You are older than 53years of age. You are at high risk for heart disease. What should I know about cancer screening? Many types of cancers can be detected early and may often be prevented. Depending on your health history and family history, you may need to have cancer screening at various ages. This may include screening for: Colorectal cancer. Prostate cancer. Skin cancer. Lung cancer. What should I know about heart disease, diabetes, and high blood pressure? Blood pressure and heart disease High blood pressure causes heart disease and increases the risk of stroke. This is more likely to develop in people who have high blood pressure readings, are of African descent, or are overweight. Talk with your health care provider about your target blood pressure readings. Have your blood pressure checked: Every 3-5 years if you are 162336years of age. Every year if you are 440years old or older. If you are between the ages of  60 and 75 and are a current or former smoker, ask your health care provider if you should have a one-time screening for abdominal aortic aneurysm (AAA). Diabetes Have regular diabetes screenings. This checks your fasting  blood sugar level. Have the screening done: Once every three years after age 37 if you are at a normal weight and have a low risk for diabetes. More often and at a younger age if you are overweight or have a high risk for diabetes. What should I know about preventing infection? Hepatitis B If you have a higher risk for hepatitis B, you should be screened for this virus. Talk with your health care provider to find out if you are at risk for hepatitis B infection. Hepatitis C Blood testing is recommended for: Everyone born from [birth year] through 1963. Anyone with known risk factors for hepatitis C. Sexually transmitted infections (STIs) You should be screened each year for STIs, including gonorrhea and chlamydia, if: You are sexually active and are younger than 53 years of age. You are older than 53 years of age and your health care provider tells you that you are at risk for this type of infection. Your sexual activity has changed since you were last screened, and you are at increased risk for chlamydia or gonorrhea. Ask your health care provider if you are at risk. Ask your health care provider about whether you are at high risk for HIV. Your health care provider may recommend a prescription medicine to help prevent HIV infection. If you choose to take medicine to prevent HIV, you should first get tested for HIV. You should then be tested every 3 months for as long as you are taking the medicine. Follow these instructions at home: Lifestyle Do not use any products that contain nicotine or tobacco, such as cigarettes, e-cigarettes, and chewing tobacco. If you need help quitting, ask your health care provider. Do not use street drugs. Do not share needles. Ask your health care provider for help if you need support or information about quitting drugs. Alcohol use Do not drink alcohol if your health care provider tells you not to drink. If you drink alcohol: Limit how much you have to 0-2 drinks a  day. Be aware of how much alcohol is in your drink. In the [place] one drink equals one 12 oz bottle of beer (355 mL), one 5 oz glass of wine (148 mL), or one 1 oz glass of hard liquor (44 mL). General instructions Schedule regular health, dental, and eye exams. Stay current with your vaccines. Tell your health care provider if: You often feel depressed. You have ever been abused or do not feel safe at home. Summary Adopting a healthy lifestyle and getting preventive care are important in promoting health and wellness. Follow your health care provider's instructions about healthy diet, exercising, and getting tested or screened for diseases. Follow your health care provider's instructions on monitoring your cholesterol and blood pressure. This information is not intended to replace advice given to you by your health care provider. Make sure you discuss any questions you have with your health care provider. Document Revised: 04/20/2020 Document Reviewed: 02/03/2018 Elsevier Patient Education  2022 [employer].

## 2020-12-18 NOTE — Assessment & Plan Note (Signed)
BP adequately controlled. Continue current management: Benicar-HCT 40-12.5 mg daily. DASH/low salt diet to continue. Continue monitoring BP at home.

## 2020-12-18 NOTE — Assessment & Plan Note (Signed)
Non pharmacologic treatment recommended for now. Further recommendations will be given according to 10 years CVD risk score and lipid panel numbers.

## 2020-12-18 NOTE — Assessment & Plan Note (Signed)
We discussed pharmacologic treatment options and side effects. Sildenafil 20 mg 2 tabs daily prn.

## 2020-12-19 ENCOUNTER — Encounter: Payer: Self-pay | Admitting: Family Medicine

## 2020-12-19 LAB — PARATHYROID HORMONE, INTACT (NO CA): PTH: 25 pg/mL (ref 16–77)

## 2020-12-19 LAB — CALCIUM, IONIZED: Calcium, Ion: 5.35 mg/dL (ref 4.8–5.6)

## 2020-12-24 DIAGNOSIS — Z1211 Encounter for screening for malignant neoplasm of colon: Secondary | ICD-10-CM | POA: Diagnosis not present

## 2020-12-26 ENCOUNTER — Other Ambulatory Visit (HOSPITAL_COMMUNITY): Payer: Self-pay

## 2020-12-29 LAB — COLOGUARD: COLOGUARD: NEGATIVE

## 2021-01-08 ENCOUNTER — Encounter: Payer: Self-pay | Admitting: Family Medicine

## 2021-01-09 ENCOUNTER — Other Ambulatory Visit (HOSPITAL_COMMUNITY): Payer: Self-pay

## 2021-01-09 ENCOUNTER — Other Ambulatory Visit: Payer: Self-pay | Admitting: Family Medicine

## 2021-01-09 DIAGNOSIS — I10 Essential (primary) hypertension: Secondary | ICD-10-CM

## 2021-01-09 MED ORDER — OLMESARTAN MEDOXOMIL-HCTZ 40-12.5 MG PO TABS
1.0000 | ORAL_TABLET | Freq: Every day | ORAL | 2 refills | Status: DC
  Filled 2021-01-09: qty 90, 90d supply, fill #0
  Filled 2021-07-21: qty 90, 90d supply, fill #1
  Filled 2021-09-10: qty 90, 90d supply, fill #2

## 2021-02-25 ENCOUNTER — Other Ambulatory Visit (HOSPITAL_COMMUNITY): Payer: Self-pay

## 2021-03-04 ENCOUNTER — Other Ambulatory Visit (HOSPITAL_COMMUNITY): Payer: Self-pay

## 2021-03-04 ENCOUNTER — Other Ambulatory Visit: Payer: Self-pay | Admitting: Family Medicine

## 2021-03-04 MED ORDER — ALLOPURINOL 100 MG PO TABS
200.0000 mg | ORAL_TABLET | Freq: Every day | ORAL | 3 refills | Status: DC
  Filled 2021-03-04: qty 180, 90d supply, fill #0
  Filled 2021-06-04: qty 180, 90d supply, fill #1
  Filled 2021-09-10: qty 180, 90d supply, fill #2
  Filled 2021-12-08: qty 180, 90d supply, fill #3

## 2021-03-09 ENCOUNTER — Ambulatory Visit (HOSPITAL_COMMUNITY)
Admission: EM | Admit: 2021-03-09 | Discharge: 2021-03-09 | Disposition: A | Discharge status: Home / Self Care | Payer: 59 | Attending: Family Medicine | Admitting: Family Medicine

## 2021-03-09 ENCOUNTER — Other Ambulatory Visit: Payer: Self-pay

## 2021-03-09 ENCOUNTER — Encounter (HOSPITAL_COMMUNITY): Payer: Self-pay

## 2021-03-09 DIAGNOSIS — N10 Acute tubulo-interstitial nephritis: Secondary | ICD-10-CM | POA: Diagnosis not present

## 2021-03-09 DIAGNOSIS — R000 Tachycardia, unspecified: Secondary | ICD-10-CM | POA: Diagnosis not present

## 2021-03-09 LAB — POCT URINALYSIS DIPSTICK, ED / UC
Bilirubin Urine: NEGATIVE
Glucose, UA: NEGATIVE mg/dL
Ketones, ur: NEGATIVE mg/dL
Nitrite: NEGATIVE
Protein, ur: NEGATIVE mg/dL
Specific Gravity, Urine: 1.015 (ref 1.005–1.030)
Urobilinogen, UA: 0.2 mg/dL (ref 0.0–1.0)
pH: 6 (ref 5.0–8.0)

## 2021-03-09 LAB — CBG MONITORING, ED: Glucose-Capillary: 113 mg/dL — ABNORMAL HIGH (ref 70–99)

## 2021-03-09 MED ORDER — CEPHALEXIN 500 MG PO CAPS: 500.0000 mg | ORAL_CAPSULE | Freq: Three times a day (TID) | ORAL | 0 refills | Status: DC

## 2021-03-09 MED ORDER — ONDANSETRON 4 MG PO TBDP: 4.0000 mg | ORAL_TABLET | Freq: Three times a day (TID) | ORAL | 0 refills | Status: DC | PRN

## 2021-03-09 MED ORDER — CEFTRIAXONE SODIUM 1 G IJ SOLR
INTRAMUSCULAR | Status: AC
  Filled 2021-03-09: qty 10

## 2021-03-09 MED ORDER — CEFTRIAXONE SODIUM 1 G IJ SOLR
1.0000 g | Freq: Once | INTRAMUSCULAR | Status: AC
  Administered 2021-03-09: 1 g via INTRAMUSCULAR

## 2021-03-09 MED ORDER — LIDOCAINE HCL (PF) 1 % IJ SOLN
INTRAMUSCULAR | Status: AC
  Filled 2021-03-09: qty 2

## 2021-03-09 NOTE — Discharge Instructions (Signed)
Please do your best to ensure adequate fluid intake in order to avoid dehydration. If you find that you are unable to tolerate drinking fluids regularly please proceed to the Emergency Department for evaluation.  Meds ordered this encounter  Medications   cefTRIAXone (ROCEPHIN) injection 1 g   cephALEXin (KEFLEX) 500 MG capsule    Sig: Take 1 capsule (500 mg total) by mouth 3 (three) times daily.    Dispense:  21 capsule    Refill:  0   ondansetron (ZOFRAN-ODT) 4 MG disintegrating tablet    Sig: Take 1 tablet (4 mg total) by mouth every 8 (eight) hours as needed for nausea or vomiting.    Dispense:  15 tablet    Refill:  0

## 2021-03-09 NOTE — ED Triage Notes (Signed)
Pt reports water fasting since Wednesday night.  States he was urinating a lot and states after 24 hours of the water fast, he had urinary urgency and fever, chills.   Pt states it started Thursday night. States he has been feeling dizzy and passed out this morning.   States he has some burning and sense of urgency.   Wife states when he passed out she checked his BP and states it was 70/42 and states his HR was in the 120's. States he still had a fever.   Pt states he took Tylenol at 0630 this morning.

## 2021-03-09 NOTE — ED Provider Notes (Signed)
Erik Rubio    ASSESSMENT & PLAN:  1. Acute pyelonephritis    Tolerating PO fluids here. VSS except for mild tachycardia; no CP. ECG: Sinus tach; no STEMI.  Meds ordered this encounter  Medications   cefTRIAXone (ROCEPHIN) injection 1 g   cephALEXin (KEFLEX) 500 MG capsule    Sig: Take 1 capsule (500 mg total) by mouth 3 (three) times daily.    Dispense:  21 capsule    Refill:  0   ondansetron (ZOFRAN-ODT) 4 MG disintegrating tablet    Sig: Take 1 tablet (4 mg total) by mouth every 8 (eight) hours as needed for nausea or vomiting.    Dispense:  15 tablet    Refill:  0   Urine culture sent.     Discharge Instructions      Please do your best to ensure adequate fluid intake in order to avoid dehydration. If you find that you are unable to tolerate drinking fluids regularly please proceed to the Emergency Department for evaluation.  Meds ordered this encounter  Medications   cefTRIAXone (ROCEPHIN) injection 1 g   cephALEXin (KEFLEX) 500 MG capsule    Sig: Take 1 capsule (500 mg total) by mouth 3 (three) times daily.    Dispense:  21 capsule    Refill:  0   ondansetron (ZOFRAN-ODT) 4 MG disintegrating tablet    Sig: Take 1 tablet (4 mg total) by mouth every 8 (eight) hours as needed for nausea or vomiting.    Dispense:  15 tablet    Refill:  0       Outlined signs and symptoms indicating need for more acute intervention. Patient verbalized understanding. After Visit Summary given.  SUBJECTIVE:  Erik Rubio. is a 56 y.o. male who complains of urinary frequency, urgency and dysuria for the past 24 hours. With subj fever. With chills. Gross hematuria: not present. No specific aggravating or alleviating factors reported. No LE edema. Normal PO intake without; mild nausea; no emesis. Without specific abdominal pain. Ambulatory without difficulty. Tylenol without much relief.  OBJECTIVE:  Vitals:   03/09/21 1035  BP: (!) 146/88  Pulse:  (!) 112  Resp: 18  Temp: 98.8 F (37.1 C)  TempSrc: Oral  SpO2: 100%   Slight tachycardia noted.  General appearance: alert; no distress HENT: oropharynx: moist Lungs: unlabored respirations Abdomen: soft Back: no CVA tenderness reported Extremities: no edema; symmetrical with no gross deformities Skin: warm and dry Neurologic: normal gait Psychological: alert and cooperative; normal mood and affect  Labs Reviewed  POCT URINALYSIS DIPSTICK, ED / UC - Abnormal; Notable for the following components:      Result Value   Hgb urine dipstick MODERATE (*)    Leukocytes,Ua LARGE (*)    All other components within normal limits  CBG MONITORING, ED - Abnormal; Notable for the following components:   Glucose-Capillary 113 (*)    All other components within normal limits  URINE CULTURE    No Known Allergies  Past Medical History:  Diagnosis Date   Arthritis    Knee OA   Hypertension    Social History   Socioeconomic History   Marital status: Married    Spouse name: Not on file   Number of children: Not on file   Years of education: Not on file   Highest education level: Not on file  Occupational History   Not on file  Tobacco Use   Smoking status: Never   Smokeless tobacco: Never  Substance  and Sexual Activity   Alcohol use: Yes    Comment: Occasional   Drug use: No   Sexual activity: Not on file  Other Topics Concern   Not on file  Social History Narrative   Not on file   Social Determinants of Health   Financial Resource Strain: Not on file  Food Insecurity: Not on file  Transportation Needs: Not on file  Physical Activity: Not on file  Stress: Not on file  Social Connections: Not on file  Intimate Partner Violence: Not on file   Family History  Problem Relation Age of Onset   Stroke Mother        aneurysm   Hypertension Mother    Hypertension Father         Vanessa Kick, MD 03/09/21 1251

## 2021-03-11 LAB — URINE CULTURE: Culture: >=100000 — AB

## 2021-03-11 NOTE — Progress Notes (Signed)
HPI: Erik Rubio. is a 56 y.o. male, who is here today to follow on recent ED visit. Evaluated in the ED on 03/09/21 because urgency and frequency with associated fever,chills,and nausea.  Passed out episode for 3 seconds, he thinks it was dehydration. He held antihypertensive medications for 2 days and resumed it 2-3 days ago.  Symptoms started during 24 hours fasting, he was drinking fluids,ate a salad before starting fasting. He is used to do up to 48 hours of fasting. Dx'ed with pyelonephritis.  Urgency and frequency have improved. Fever and nausea have resolved.  Negative for dysuria,gross hematuria,or decreased urine output.  Initially he has mild difficulty starting urination. Negative for rectal pain. He is on Cephalexin 500 mg tid to complete 7 days. Zofran 4 mg was prescribed for nausea.  HTN: He is on Olmesartan-HCTZ 40-12.5 mg daily. Negative for severe/frequent headache, visual changes, chest pain, dyspnea, palpitation, focal weakness, or edema.  Component     Latest Ref Rng & Units 12/18/2020  Sodium     135 - 145 mEq/L 137  Potassium     3.5 - 5.1 mEq/L 4.5  Chloride     96 - 112 mEq/L 98  CO2     19 - 32 mEq/L 30  Glucose     70 - 99 mg/dL 100 (H)  BUN     6 - 23 mg/dL 19  Creatinine     0.40 - 1.50 mg/dL 1.29   Review of Systems  Constitutional:  Positive for fatigue. Negative for activity change and appetite change.  HENT:  Negative for nosebleeds and sore throat.   Respiratory:  Negative for cough and wheezing.   Gastrointestinal:  Negative for abdominal pain and vomiting.  Genitourinary:  Negative for penile discharge and testicular pain.  Musculoskeletal:  Negative for gait problem and myalgias.  Skin:  Negative for pallor and rash.  Neurological:  Negative for syncope, facial asymmetry and weakness.  Rest see pertinent positives and negatives per HPI.  Current Outpatient Medications on File Prior to Visit  Medication Sig  Dispense Refill   allopurinol (ZYLOPRIM) 100 MG tablet Take 2 tablets (200 mg total) by mouth daily. 180 tablet 3   cephALEXin (KEFLEX) 500 MG capsule Take 1 capsule (500 mg total) by mouth 3 (three) times daily. 21 capsule 0   colchicine 0.6 MG tablet Take 1 tablet (0.6 mg total) by mouth 2 (two) times daily as needed. 30 tablet 1   olmesartan-hydrochlorothiazide (BENICAR HCT) 40-12.5 MG tablet Take 1 tablet by mouth daily. 90 tablet 2   ondansetron (ZOFRAN-ODT) 4 MG disintegrating tablet Take 1 tablet (4 mg total) by mouth every 8 (eight) hours as needed for nausea or vomiting. 15 tablet 0   sildenafil (REVATIO) 20 MG tablet Take 2 tablets (40 mg total) by mouth daily as needed. 60 tablet 1   lovastatin (MEVACOR) 20 MG tablet Take 1 tablet (20 mg total) by mouth at bedtime. (Patient not taking: Reported on 03/12/2021) 30 tablet 3   No current facility-administered medications on file prior to visit.   Past Medical History:  Diagnosis Date   Arthritis    Knee OA   Hypertension    No Known Allergies  Social History   Socioeconomic History   Marital status: Married    Spouse name: Not on file   Number of children: Not on file   Years of education: Not on file   Highest education level: Not on file  Occupational History  Not on file  Tobacco Use   Smoking status: Never   Smokeless tobacco: Never  Substance and Sexual Activity   Alcohol use: Yes    Comment: Occasional   Drug use: No   Sexual activity: Not on file  Other Topics Concern   Not on file  Social History Narrative   Not on file   Social Determinants of Health   Financial Resource Strain: Not on file  Food Insecurity: Not on file  Transportation Needs: Not on file  Physical Activity: Not on file  Stress: Not on file  Social Connections: Not on file   Vitals:   03/12/21 0950  BP: 128/80  Pulse: 96  Resp: 16  SpO2: 97%   Body mass index is 28.63 kg/m.  Physical Exam Nursing note reviewed.   Constitutional:      General: He is not in acute distress.    Appearance: He is well-developed.  HENT:     Head: Normocephalic and atraumatic.  Eyes:     Conjunctiva/sclera: Conjunctivae normal.  Cardiovascular:     Rate and Rhythm: Normal rate and regular rhythm.     Heart sounds: No murmur heard. Pulmonary:     Effort: Pulmonary effort is normal. No respiratory distress.     Breath sounds: Normal breath sounds.  Abdominal:     Palpations: Abdomen is soft. There is no hepatomegaly or mass.     Tenderness: There is no abdominal tenderness. There is no right CVA tenderness or left CVA tenderness.  Lymphadenopathy:     Cervical: No cervical adenopathy.  Skin:    General: Skin is warm.     Findings: No erythema or rash.  Neurological:     Mental Status: He is alert and oriented to person, place, and time.     Cranial Nerves: No cranial nerve deficit.     Gait: Gait normal.  Psychiatric:     Comments: Well groomed, good eye contact.   ASSESSMENT AND PLAN:  Mr.Sony was seen today for follow-up.  Diagnoses and all orders for this visit: Orders Placed This Encounter  Procedures   Basic metabolic panel   CBC   PSA   Lab Results  Component Value Date   CREATININE 1.24 03/12/2021   BUN 17 03/12/2021   NA 136 03/12/2021   K 3.8 03/12/2021   CL 98 03/12/2021   CO2 28 03/12/2021   Lab Results  Component Value Date   WBC 10.2 03/12/2021   HGB 15.5 03/12/2021   HCT 46.9 03/12/2021   MCV 90.7 03/12/2021   PLT 211.0 03/12/2021   Lab Results  Component Value Date   PSA 71.12 (H) 03/12/2021   Urinary frequency Improved. Differential Dx discussed. Prostatitis is in the differential Dx's.  Pyelonephritis, acute Complete abx treatment. Further recommendations will be given according to PSA and CBC results. Instructed about warning signs.  Hypertension, essential, benign BP adequately controlled. Continue Olmesartan-HCTZ 40-12.5 mg daily and low salt  diet. Continue monitoring BP regularly.  See result note: Elevated PSA. Empiric treatment for acute prostatitis started, Cipro 500 mg bid x 28 d. PSA in 4 weeks .  Return if symptoms worsen or fail to improve, for Keep next appt..  Guynell Kleiber G. Martinique, MD  [facility]. [place] office.

## 2021-03-12 ENCOUNTER — Encounter: Payer: Self-pay | Admitting: Family Medicine

## 2021-03-12 ENCOUNTER — Ambulatory Visit: Payer: 59 | Admitting: Family Medicine

## 2021-03-12 VITALS — BP 128/80 | HR 96 | Resp 16 | Ht 70.0 in | Wt 199.5 lb

## 2021-03-12 DIAGNOSIS — N10 Acute tubulo-interstitial nephritis: Secondary | ICD-10-CM | POA: Diagnosis not present

## 2021-03-12 DIAGNOSIS — R972 Elevated prostate specific antigen [PSA]: Secondary | ICD-10-CM

## 2021-03-12 DIAGNOSIS — I10 Essential (primary) hypertension: Secondary | ICD-10-CM

## 2021-03-12 DIAGNOSIS — R350 Frequency of micturition: Secondary | ICD-10-CM

## 2021-03-12 LAB — BASIC METABOLIC PANEL
BUN: 17 mg/dL (ref 6–23)
CO2: 28 mEq/L (ref 19–32)
Calcium: 9.6 mg/dL (ref 8.4–10.5)
Chloride: 98 mEq/L (ref 96–112)
Creatinine, Ser: 1.24 mg/dL (ref 0.40–1.50)
GFR: 66.49 mL/min (ref >60.00)
Glucose, Bld: 97 mg/dL (ref 70–99)
Potassium: 3.8 mEq/L (ref 3.5–5.1)
Sodium: 136 mEq/L (ref 135–145)

## 2021-03-12 LAB — CBC
HCT: 46.9 % (ref 39.0–52.0)
Hemoglobin: 15.5 g/dL (ref 13.0–17.0)
MCHC: 33 g/dL (ref 30.0–36.0)
MCV: 90.7 fl (ref 78.0–100.0)
Platelets: 211 10*3/uL (ref 150.0–400.0)
RBC: 5.17 Mil/uL (ref 4.22–5.81)
RDW: 14.1 % (ref 11.5–15.5)
WBC: 10.2 10*3/uL (ref 4.0–10.5)

## 2021-03-12 LAB — PSA: PSA: 71.12 ng/mL — ABNORMAL HIGH (ref 0.10–4.00)

## 2021-03-12 NOTE — Patient Instructions (Signed)
A few things to remember from today's visit:  Urinary frequency - Plan: Basic metabolic panel, PSA  Pyelonephritis, acute - Plan: Basic metabolic panel, CBC  Hypertension, essential, benign  If you need refills please call your pharmacy. Do not use My Chart to request refills or for acute issues that need immediate attention.   Complete antibiotic treatment. Adequate hydration. Continue monitoring blood pressure.  Please be sure medication list is accurate. If a new problem present, please set up appointment sooner than planned today.

## 2021-03-14 ENCOUNTER — Encounter: Payer: Self-pay | Admitting: Family Medicine

## 2021-03-15 ENCOUNTER — Other Ambulatory Visit: Payer: Self-pay | Admitting: Family Medicine

## 2021-03-15 ENCOUNTER — Other Ambulatory Visit (HOSPITAL_COMMUNITY): Payer: Self-pay

## 2021-03-15 ENCOUNTER — Telehealth: Payer: Self-pay | Admitting: Family Medicine

## 2021-03-15 DIAGNOSIS — N410 Acute prostatitis: Secondary | ICD-10-CM

## 2021-03-15 MED ORDER — CIPROFLOXACIN HCL 500 MG PO TABS
500.0000 mg | ORAL_TABLET | Freq: Two times a day (BID) | ORAL | 0 refills | Status: DC
  Filled 2021-03-15: qty 56, 28d supply, fill #0

## 2021-03-15 NOTE — Telephone Encounter (Signed)
Pt is calling and has sent mychart message and would like to know the next step due to his PSA is elevated. Please advise

## 2021-03-15 NOTE — Telephone Encounter (Signed)
See my chart encounter.

## 2021-03-17 ENCOUNTER — Encounter: Payer: Self-pay | Admitting: Family Medicine

## 2021-03-18 ENCOUNTER — Other Ambulatory Visit: Payer: Self-pay | Admitting: Family Medicine

## 2021-03-18 ENCOUNTER — Other Ambulatory Visit (HOSPITAL_COMMUNITY): Payer: Self-pay

## 2021-03-18 DIAGNOSIS — N410 Acute prostatitis: Secondary | ICD-10-CM

## 2021-03-18 MED ORDER — SULFAMETHOXAZOLE-TRIMETHOPRIM 800-160 MG PO TABS
1.0000 | ORAL_TABLET | Freq: Two times a day (BID) | ORAL | 0 refills | Status: DC
  Filled 2021-03-18: qty 56, 28d supply, fill #0

## 2021-04-02 ENCOUNTER — Other Ambulatory Visit (HOSPITAL_COMMUNITY): Payer: Self-pay

## 2021-04-04 ENCOUNTER — Ambulatory Visit: Payer: 59 | Admitting: Internal Medicine

## 2021-04-04 ENCOUNTER — Encounter: Payer: Self-pay | Admitting: Family Medicine

## 2021-04-04 VITALS — BP 116/78 | HR 96 | Temp 98.9°F | Ht 70.0 in | Wt 200.9 lb

## 2021-04-04 DIAGNOSIS — R39198 Other difficulties with micturition: Secondary | ICD-10-CM

## 2021-04-04 DIAGNOSIS — N410 Acute prostatitis: Secondary | ICD-10-CM

## 2021-04-04 MED ORDER — TAMSULOSIN HCL 0.4 MG PO CAPS: 0.4000 mg | ORAL_CAPSULE | Freq: Every day | ORAL | 1 refills | Status: DC

## 2021-04-04 NOTE — Progress Notes (Signed)
Established Patient Office Visit     This visit occurred during the SARS-CoV-2 public health emergency.  Safety protocols were in place, including screening questions prior to the visit, additional usage of staff PPE, and extensive cleaning of exam room while observing appropriate contact time as indicated for disinfecting solutions.    CC/Reason for Visit: Difficulty urinating  HPI: Erik Rubio. is a 56 y.o. male who is coming in today for the above mentioned reasons.  He visited the emergency department on January 14 after a high fever and low blood pressure and was diagnosed with presumptive pyelonephritis although no imaging was performed.  His urine culture from that visit did grow E. coli resistant to ampicillin and ampicillin sulbactam.  He then saw his PCP, Dr. Martinique on January 17.  A PSA from that visit was elevated to 71.  She thought the diagnosis with more consistent with prostatitis, which I agree with, he was placed on 3 to 4-week course of ciprofloxacin.  Unfortunately he developed hives after a few days on the Cipro and he was transitioned over to Bactrim.  Today he is on day 17 of Bactrim.  Last night he noticed difficulty urinating, straining to urinate, a sensation of incomplete bladder emptying and burning which prompted his visit today as he did not want to become obstructed over the weekend and require another ED visit.  He already made an appointment to see urology later this month.  Past Medical/Surgical History: Past Medical History:  Diagnosis Date   Arthritis    Knee OA   Hypertension     Past Surgical History:  Procedure Laterality Date   CHOLECYSTECTOMY  2003    Social History:  reports that he has never smoked. He has never used smokeless tobacco. He reports current alcohol use. He reports that he does not use drugs.  Allergies: Allergies  Allergen Reactions   Ciprofloxacin Hives    Family History:  Family History  Problem  Relation Age of Onset   Stroke Mother        aneurysm   Hypertension Mother    Hypertension Father      Current Outpatient Medications:    allopurinol (ZYLOPRIM) 100 MG tablet, Take 2 tablets (200 mg total) by mouth daily., Disp: 180 tablet, Rfl: 3   colchicine 0.6 MG tablet, Take 1 tablet (0.6 mg total) by mouth 2 (two) times daily as needed., Disp: 30 tablet, Rfl: 1   lovastatin (MEVACOR) 20 MG tablet, Take 1 tablet (20 mg total) by mouth at bedtime., Disp: 30 tablet, Rfl: 3   olmesartan-hydrochlorothiazide (BENICAR HCT) 40-12.5 MG tablet, Take 1 tablet by mouth daily., Disp: 90 tablet, Rfl: 2   sildenafil (REVATIO) 20 MG tablet, Take 2 tablets (40 mg total) by mouth daily as needed., Disp: 60 tablet, Rfl: 1   sulfamethoxazole-trimethoprim (BACTRIM DS) 800-160 MG tablet, Take 1 tablet by mouth 2 (two) times daily., Disp: 56 tablet, Rfl: 0   tamsulosin (FLOMAX) 0.4 MG CAPS capsule, Take 1 capsule (0.4 mg total) by mouth daily., Disp: 90 capsule, Rfl: 1  Review of Systems:  Constitutional: Denies fever, chills, diaphoresis, appetite change.  HEENT: Denies photophobia, eye pain, redness, hearing loss, ear pain, congestion, sore throat, rhinorrhea, sneezing, mouth sores, trouble swallowing, neck pain, neck stiffness and tinnitus.   Respiratory: Denies SOB, DOE, cough, chest tightness,  and wheezing.   Cardiovascular: Denies chest pain, palpitations and leg swelling.  Gastrointestinal: Denies nausea, vomiting, abdominal pain, diarrhea, constipation, blood in  stool and abdominal distention.  Genitourinary: Denies  hematuria, flank pain Endocrine: Denies: hot or cold intolerance, sweats, changes in hair or nails, polyuria, polydipsia. Musculoskeletal: Denies myalgias, back pain, joint swelling, arthralgias and gait problem.  Skin: Denies pallor, rash and wound.  Neurological: Denies dizziness, seizures, syncope, weakness, light-headedness, numbness and headaches.  Hematological: Denies  adenopathy. Easy bruising, personal or family bleeding history  Psychiatric/Behavioral: Denies suicidal ideation, mood changes, confusion, nervousness, sleep disturbance and agitation    Physical Exam: Vitals:   04/04/21 1306  BP: 116/78  Pulse: 96  Temp: 98.9 F (37.2 C)  TempSrc: Oral  SpO2: 99%  Weight: 200 lb 14.4 oz (91.1 kg)  Height: 5' 10"  (1.778 m)    Body mass index is 28.83 kg/m.   Constitutional: NAD, calm, comfortable Eyes: PERRL, lids and conjunctivae normal ENMT: Mucous membranes are moist.  Neurologic: Grossly intact and nonfocal Psychiatric: Normal judgment and insight. Alert and oriented x 3. Normal mood.    Impression and Plan:  Difficulty urinating - Plan: tamsulosin (FLOMAX) 0.4 MG CAPS capsule  Acute prostatitis  -I agree with the diagnosis of acute prostatitis and have recommended that he continue his course of Bactrim.  I will go ahead and start him on Flomax as I imagine he has developed some urinary obstruction due to his prostatitis.  I also agree with following through with urology referral.  Time spent: 31 minutes reviewing hospital charts, reviewing outpatient charts, interviewing and examining patient and formulating plan of care.     Lelon Frohlich, MD  Primary [facility] at [facility]

## 2021-04-15 NOTE — Progress Notes (Signed)
HPI: [name] is a 55 y.o. male, who is here today to follow on recent visit. Seen on 03/12/21 for ED follow up. Dx'ed with acute prostatitis, recommended Cipro but developed skin pruritus. Started on Bactrim, completed treatment yesterday.  He was evaluated on 04/04/21, started on Flomax, which gradually started helping with urination/frequency.  Yesterday he started with urinary frequency,decreased urine stream, and urinary tenesmus. Decreased urine stream. Negative for dysuria and gross hematuria.  Negative for fever,chills, abdominal pain,dysuria,gross hematuria, or rectal pain.  Lab Results  Component Value Date   WBC 10.2 03/12/2021   HGB 15.5 03/12/2021   HCT 46.9 03/12/2021   MCV 90.7 03/12/2021   PLT 211.0 03/12/2021   Lab Results  Component Value Date   PSA 71.12 (H) 03/12/2021   Review of Systems  Constitutional:  Positive for fatigue. Negative for activity change, appetite change and fever.  Respiratory:  Negative for cough and shortness of breath.   Cardiovascular:  Negative for chest pain and leg swelling.  Gastrointestinal:  Negative for nausea and vomiting.  Genitourinary:  Negative for penile discharge and testicular pain.  Musculoskeletal:  Negative for gait problem and myalgias.  Skin:  Negative for pallor and rash.  Neurological:  Negative for syncope, weakness and headaches.  Rest see pertinent positives and negatives per HPI.  Current Outpatient Medications on File Prior to Visit  Medication Sig Dispense Refill   allopurinol (ZYLOPRIM) 100 MG tablet Take 2 tablets (200 mg total) by mouth daily. 180 tablet 3   colchicine 0.6 MG tablet Take 1 tablet (0.6 mg total) by mouth 2 (two) times daily as needed. 30 tablet 1   lovastatin (MEVACOR) 20 MG tablet Take 1 tablet (20 mg total) by mouth at bedtime. 30 tablet 3   olmesartan-hydrochlorothiazide (BENICAR HCT) 40-12.5 MG tablet Take 1 tablet by mouth daily. 90 tablet 2   sildenafil  (REVATIO) 20 MG tablet Take 2 tablets (40 mg total) by mouth daily as needed. 60 tablet 1   tamsulosin (FLOMAX) 0.4 MG CAPS capsule Take 1 capsule (0.4 mg total) by mouth daily. 90 capsule 1   No current facility-administered medications on file prior to visit.   Past Medical History:  Diagnosis Date   Arthritis    Knee OA   Hypertension    Allergies  Allergen Reactions   Ciprofloxacin Hives   Social History   Socioeconomic History   Marital status: Married    Spouse name: Not on file   Number of children: Not on file   Years of education: Not on file   Highest education level: Bachelor's degree (e.g., BA, AB, BS)  Occupational History   Not on file  Tobacco Use   Smoking status: Never   Smokeless tobacco: Never  Substance and Sexual Activity   Alcohol use: Yes    Comment: Occasional   Drug use: No   Sexual activity: Not on file  Other Topics Concern   Not on file  Social History Narrative   Not on file   Social Determinants of Health   Financial Resource Strain: Low Risk    Difficulty of Paying Living Expenses: Not hard at all  Food Insecurity: No Food Insecurity   Worried About Charity fundraiser in the Last Year: Never true   [place] in the Last Year: Never true  Transportation Needs: No Transportation Needs   Lack of Transportation (Medical): No   Lack of Transportation (Non-Medical): No  Physical Activity: Sufficiently Active  Days of Exercise per Week: 5 days   Minutes of Exercise per Session: 30 min  Stress: No Stress Concern Present   Feeling of Stress : Not at all  Social Connections: Socially Integrated   Frequency of Communication with Friends and Family: More than three times a week   Frequency of Social Gatherings with Friends and Family: More than three times a week   Attends Religious Services: More than 4 times per year   Active Member of Clubs or Organizations: Yes   Attends Archivist Meetings: 1 to 4 times per year    Marital Status: Married   Vitals:   04/16/21 0749 04/16/21 0818  BP: 128/70   Pulse: (!) 113 100  Resp: 16   SpO2: 99%    Body mass index is 28.45 kg/m.  Physical Exam Vitals and nursing note reviewed.  Constitutional:      General: He is not in acute distress.    Appearance: He is well-developed.  HENT:     Head: Normocephalic and atraumatic.     Mouth/Throat:     Mouth: Mucous membranes are dry.  Eyes:     Conjunctiva/sclera: Conjunctivae normal.  Cardiovascular:     Rate and Rhythm: Normal rate and regular rhythm.     Heart sounds: No murmur heard. Pulmonary:     Effort: Pulmonary effort is normal. No respiratory distress.     Breath sounds: Normal breath sounds.  Abdominal:     Palpations: Abdomen is soft. There is no mass.     Tenderness: There is no abdominal tenderness.  Genitourinary:    Comments: Deferred to urologist. Lymphadenopathy:     Cervical: No cervical adenopathy.  Skin:    General: Skin is warm.     Findings: No erythema or rash.  Neurological:     Mental Status: He is alert and oriented to person, place, and time.     Cranial Nerves: No cranial nerve deficit.     Gait: Gait normal.  Psychiatric:     Comments: Well groomed, good eye contact.   ASSESSMENT AND PLAN:  Erik Rubio was seen today for follow-up.  Diagnoses and all orders for this visit: Orders Placed This Encounter  Procedures   Urinalysis with Culture Reflex   Lab Results  Component Value Date   PSA 12.62 (H) 04/16/2021   PSA 71.12 (H) 03/12/2021   PSA 2.87 12/18/2020   Urinary frequency Recurrent. We discussed possible etiologies. He just completed Bactrim 28 days treatment. BPH like symptoms, continue Flomax 0.4 mg daily. We can consider chaning Sildenafil for Cialis 5 mg daily of symptoms are persistent. Further recommendations according to UA results. Keep appt with urologist. Instructed about warning signs.  Elevated PSA Treated as acute prostatitis.  Differential Dx's discussed. Further recommendation according to PSA. He has an appt with urologist next week.  Return if symptoms worsen or fail to improve.  Keerthi Hazell G. Martinique, MD  [facility]. [place] office.

## 2021-04-16 ENCOUNTER — Encounter: Payer: Self-pay | Admitting: Family Medicine

## 2021-04-16 ENCOUNTER — Ambulatory Visit: Payer: 59 | Admitting: Family Medicine

## 2021-04-16 VITALS — BP 128/70 | HR 100 | Resp 16 | Ht 70.0 in | Wt 198.2 lb

## 2021-04-16 DIAGNOSIS — R350 Frequency of micturition: Secondary | ICD-10-CM

## 2021-04-16 DIAGNOSIS — R972 Elevated prostate specific antigen [PSA]: Secondary | ICD-10-CM

## 2021-04-16 LAB — PSA: PSA: 12.62 ng/mL — ABNORMAL HIGH (ref 0.10–4.00)

## 2021-04-16 NOTE — Patient Instructions (Signed)
A few things to remember from today's visit:  Urinary frequency - Plan: Urinalysis with Culture Reflex  Elevated PSA - Plan: PSA  If you need refills please call your pharmacy. Do not use My Chart to request refills or for acute issues that need immediate attention.   We could consider daily Cialis 5 mg for symptoms after PSA is back to normal and if still having urinary frequency. Keep appt with urologist. Continue Flomax.  Please be sure medication list is accurate. If a new problem present, please set up appointment sooner than planned today.

## 2021-04-17 LAB — URINALYSIS W MICROSCOPIC + REFLEX CULTURE
Bacteria, UA: NONE SEEN /HPF
Bilirubin Urine: NEGATIVE
Glucose, UA: NEGATIVE
Hgb urine dipstick: NEGATIVE
Hyaline Cast: NONE SEEN /LPF
Ketones, ur: NEGATIVE
Leukocyte Esterase: NEGATIVE
Nitrites, Initial: NEGATIVE
Protein, ur: NEGATIVE
RBC / HPF: NONE SEEN /HPF (ref 0–2)
Specific Gravity, Urine: 1.015 (ref 1.001–1.035)
Squamous Epithelial / HPF: NONE SEEN /HPF (ref <=5)
pH: 6 (ref 5.0–8.0)

## 2021-04-17 LAB — NO CULTURE INDICATED

## 2021-04-22 ENCOUNTER — Other Ambulatory Visit (HOSPITAL_COMMUNITY): Payer: Self-pay

## 2021-04-22 DIAGNOSIS — R3912 Poor urinary stream: Secondary | ICD-10-CM | POA: Diagnosis not present

## 2021-04-22 DIAGNOSIS — N401 Enlarged prostate with lower urinary tract symptoms: Secondary | ICD-10-CM | POA: Diagnosis not present

## 2021-04-22 DIAGNOSIS — N410 Acute prostatitis: Secondary | ICD-10-CM | POA: Diagnosis not present

## 2021-04-22 DIAGNOSIS — R972 Elevated prostate specific antigen [PSA]: Secondary | ICD-10-CM | POA: Diagnosis not present

## 2021-04-22 MED ORDER — SILODOSIN 8 MG PO CAPS
8.0000 mg | ORAL_CAPSULE | Freq: Every day | ORAL | 11 refills | Status: DC
  Filled 2021-04-22: qty 30, 30d supply, fill #0
  Filled 2021-05-16: qty 30, 30d supply, fill #1
  Filled 2021-06-17: qty 30, 30d supply, fill #2
  Filled 2021-07-21: qty 30, 30d supply, fill #3
  Filled 2021-08-19: qty 30, 30d supply, fill #4
  Filled 2021-09-16: qty 30, 30d supply, fill #5
  Filled 2021-10-21: qty 30, 30d supply, fill #6

## 2021-05-17 ENCOUNTER — Other Ambulatory Visit (HOSPITAL_COMMUNITY): Payer: Self-pay

## 2021-05-27 DIAGNOSIS — N401 Enlarged prostate with lower urinary tract symptoms: Secondary | ICD-10-CM | POA: Diagnosis not present

## 2021-05-27 DIAGNOSIS — R3912 Poor urinary stream: Secondary | ICD-10-CM | POA: Diagnosis not present

## 2021-05-27 DIAGNOSIS — H35033 Hypertensive retinopathy, bilateral: Secondary | ICD-10-CM | POA: Diagnosis not present

## 2021-05-27 DIAGNOSIS — I10 Essential (primary) hypertension: Secondary | ICD-10-CM | POA: Diagnosis not present

## 2021-05-27 DIAGNOSIS — H2513 Age-related nuclear cataract, bilateral: Secondary | ICD-10-CM | POA: Diagnosis not present

## 2021-05-27 DIAGNOSIS — H5203 Hypermetropia, bilateral: Secondary | ICD-10-CM | POA: Diagnosis not present

## 2021-05-27 DIAGNOSIS — R972 Elevated prostate specific antigen [PSA]: Secondary | ICD-10-CM | POA: Diagnosis not present

## 2021-06-05 ENCOUNTER — Other Ambulatory Visit (HOSPITAL_COMMUNITY): Payer: Self-pay

## 2021-06-17 NOTE — Progress Notes (Signed)
? ?HPI: ?[name] is a 56 y.o. male, who is here today for follow up. ?He was last seen on 04/16/21, since his last visit he has seen urologist. Urinary symptoms have resolved. ? ?Hyperlipidemia: ?Currently on non pharmacologic treatment, Lovastatin 20 mg was recommended, he did not try, wanted to see if he could manage problem with diet. ?He eats home made meals, a lot of greens and fresh vegetables.. ? ?Lab Results  ?Component Value Date  ? CHOL 226 (H) 12/18/2020  ? HDL 58.80 12/18/2020  ? LDLCALC 129 (H) 12/18/2020  ? TRIG 193.0 (H) 12/18/2020  ? CHOLHDL 4 12/18/2020  ? ?Hypertension: SBP slightly elevated today. ?Medications:Benicar-HCTZ 40-12.5 mg daily. ?BP readings at home:120's/70-80's. ?Negative for unusual or severe headache, visual changes, exertional chest pain, dyspnea,  focal weakness, or edema. ? ?Lab Results  ?Component Value Date  ? CREATININE 1.24 03/12/2021  ? BUN 17 03/12/2021  ? NA 136 03/12/2021  ? K 3.8 03/12/2021  ? CL 98 03/12/2021  ? CO2 28 03/12/2021  ? ?Review of Systems  ?Constitutional:  Negative for activity change, appetite change and fever.  ?HENT:  Negative for nosebleeds and sore throat.   ?Respiratory:  Negative for cough and wheezing.   ?Gastrointestinal:  Negative for abdominal pain, nausea and vomiting.  ?Genitourinary:  Negative for decreased urine volume and hematuria.  ?Neurological:  Negative for syncope and facial asymmetry.  ?Rest see pertinent positives and negatives per HPI. ? ?Current Outpatient Medications on File Prior to Visit  ?Medication Sig Dispense Refill  ? allopurinol (ZYLOPRIM) 100 MG tablet Take 2 tablets (200 mg total) by mouth daily. 180 tablet 3  ? colchicine 0.6 MG tablet Take 1 tablet (0.6 mg total) by mouth 2 (two) times daily as needed. 30 tablet 1  ? olmesartan-hydrochlorothiazide (BENICAR HCT) 40-12.5 MG tablet Take 1 tablet by mouth daily. 90 tablet 2  ? sildenafil (REVATIO) 20 MG tablet Take 2 tablets (40 mg total) by mouth  daily as needed. 60 tablet 1  ? silodosin (RAPAFLO) 8 MG CAPS capsule Take 1 capsule (8 mg total) by mouth daily. 30 capsule 11  ? ?No current facility-administered medications on file prior to visit.  ? ?Past Medical History:  ?Diagnosis Date  ? Arthritis   ? Knee OA  ? Hypertension   ? ?Allergies  ?Allergen Reactions  ? Ciprofloxacin Hives  ? ?Social History  ? ?Socioeconomic History  ? Marital status: Married  ?  Spouse name: Not on file  ? Number of children: Not on file  ? Years of education: Not on file  ? Highest education level: Bachelor's degree (e.g., BA, AB, BS)  ?Occupational History  ? Not on file  ?Tobacco Use  ? Smoking status: Never  ? Smokeless tobacco: Never  ?Substance and Sexual Activity  ? Alcohol use: Yes  ?  Comment: Occasional  ? Drug use: No  ? Sexual activity: Not on file  ?Other Topics Concern  ? Not on file  ?Social History Narrative  ? Not on file  ? ?Social Determinants of Health  ? ?Financial Resource Strain: Low Risk   ? Difficulty of Paying Living Expenses: Not hard at all  ?Food Insecurity: No Food Insecurity  ? Worried About Charity fundraiser in the Last Year: Never true  ? Ran Out of Food in the Last Year: Never true  ?Transportation Needs: No Transportation Needs  ? Lack of Transportation (Medical): No  ? Lack of Transportation (Non-Medical): No  ?Physical  Activity: Sufficiently Active  ? Days of Exercise per Week: 5 days  ? Minutes of Exercise per Session: 30 min  ?Stress: No Stress Concern Present  ? Feeling of Stress : Not at all  ?Social Connections: Socially Integrated  ? Frequency of Communication with Friends and Family: More than three times a week  ? Frequency of Social Gatherings with Friends and Family: More than three times a week  ? Attends Religious Services: More than 4 times per year  ? Active Member of Clubs or Organizations: Yes  ? Attends Archivist Meetings: 1 to 4 times per year  ? Marital Status: Married  ? ?Vitals:  ? 06/18/21 0751  ?BP: 140/80   ?Pulse: 80  ?Resp: 16  ?Temp: 98.6 ?F (37 ?C)  ?SpO2: 99%  ? ?Wt Readings from Last 3 Encounters:  ?06/18/21 199 lb 6 oz (90.4 kg)  ?04/16/21 198 lb 4 oz (89.9 kg)  ?04/04/21 200 lb 14.4 oz (91.1 kg)  ?Body mass index is 28.61 kg/m?. ? ?Physical Exam ?Vitals and nursing note reviewed.  ?Constitutional:   ?   General: He is not in acute distress. ?   Appearance: He is well-developed.  ?HENT:  ?   Head: Normocephalic and atraumatic.  ?Eyes:  ?   Conjunctiva/sclera: Conjunctivae normal.  ?Cardiovascular:  ?   Rate and Rhythm: Normal rate and regular rhythm.  ?   Pulses:     ?     Dorsalis pedis pulses are 2+ on the right side and 2+ on the left side.  ?   Heart sounds: No murmur heard. ?Pulmonary:  ?   Effort: Pulmonary effort is normal. No respiratory distress.  ?   Breath sounds: Normal breath sounds.  ?Abdominal:  ?   Palpations: Abdomen is soft. There is no hepatomegaly or mass.  ?   Tenderness: There is no abdominal tenderness.  ?Lymphadenopathy:  ?   Cervical: No cervical adenopathy.  ?Skin: ?   General: Skin is warm.  ?   Findings: No erythema or rash.  ?Neurological:  ?   Mental Status: He is alert and oriented to person, place, and time.  ?   Cranial Nerves: No cranial nerve deficit.  ?   Gait: Gait normal.  ?Psychiatric:  ?   Comments: Well groomed, good eye contact.  ? ?ASSESSMENT AND PLAN: ? ?[name] was seen today for follow-up. ? ?Diagnoses and all orders for this visit: ?Orders Placed This Encounter  ?Procedures  ? Lipid panel  ? ?Lab Results  ?Component Value Date  ? CHOL 236 (H) 06/18/2021  ? HDL 64.90 06/18/2021  ? LDLCALC 139 (H) 06/18/2021  ? TRIG 161.0 (H) 06/18/2021  ? CHOLHDL 4 06/18/2021  ?The 10-year ASCVD risk score (Arnett DK, et al., 2019) is: 6.1% ?  Values used to calculate the score: ?    Age: 40 years ?    Sex: Male ?    Is Non-Hispanic African American: No ?    Diabetic: No ?    Tobacco smoker: No ?    Systolic Blood Pressure: 470 mmHg ?    Is BP treated: Yes ?    HDL Cholesterol:  64.9 mg/dL ?    Total Cholesterol: 236 mg/dL ? ?Hyperlipidemia ?Non pharmacologic treatment recommended for now. ?Further recommendations will be given according to 10 years CVD risk score and lipid panel numbers. ? ?Hypertension, essential, benign ?Reporting home BP's 120/70-80's. ?Continue Benicar-HCTZ 40-12.5 mg daily and low salt diet. ?Continue monitoring BP regularly. ? ?  Overweight (BMI 25.0-29.9) ?Wt otherwise stable. ?Consistency with healthy diet and physical activity encouraged, small changes at the time. ? ?Return in about 6 months (around 12/20/2021) for cpe. ? ?Kischa Altice G. Martinique, MD ? ?[place]. ?[place] [facility]. ? ? ? ? ? ? ? ? ? ? ? ? ? ? ? ?

## 2021-06-18 ENCOUNTER — Other Ambulatory Visit (HOSPITAL_COMMUNITY): Payer: Self-pay

## 2021-06-18 ENCOUNTER — Encounter: Payer: Self-pay | Admitting: Family Medicine

## 2021-06-18 ENCOUNTER — Ambulatory Visit: Payer: 59 | Admitting: Family Medicine

## 2021-06-18 VITALS — BP 140/80 | HR 80 | Temp 98.6°F | Resp 16 | Ht 70.0 in | Wt 199.4 lb

## 2021-06-18 DIAGNOSIS — I10 Essential (primary) hypertension: Secondary | ICD-10-CM

## 2021-06-18 DIAGNOSIS — E785 Hyperlipidemia, unspecified: Secondary | ICD-10-CM | POA: Diagnosis not present

## 2021-06-18 DIAGNOSIS — E663 Overweight: Secondary | ICD-10-CM | POA: Diagnosis not present

## 2021-06-18 LAB — LIPID PANEL
Cholesterol: 236 mg/dL — ABNORMAL HIGH (ref 0–200)
HDL: 64.9 mg/dL (ref >39.00)
LDL Cholesterol: 139 mg/dL — ABNORMAL HIGH (ref 0–99)
NonHDL: 170.7
Total CHOL/HDL Ratio: 4
Triglycerides: 161 mg/dL — ABNORMAL HIGH (ref 0.0–149.0)
VLDL: 32.2 mg/dL (ref 0.0–40.0)

## 2021-06-18 NOTE — Patient Instructions (Addendum)
A few things to remember from today's visit: ? ?Hyperlipidemia, unspecified hyperlipidemia type - Plan: Lipid panel ? ?Hypertension, essential, benign ? ?If you need refills please call your pharmacy. ?Do not use My Chart to request refills or for acute issues that need immediate attention. ?  ?Continue monitoring blood pressure at home. ? ?Please be sure medication list is accurate. ?If a new problem present, please set up appointment sooner than planned today. ? ?Controlling High Cholesterol ?Cholesterol is a white, waxy substance similar to fat that the human body needs to help build cells. The liver makes all the cholesterol that a person's body needs. Having high cholesterol (hypercholesterolemia) increases your risk for heart disease and stroke. Extra or excess cholesterol comes from the food that you eat. ?High cholesterol can often be prevented with diet and lifestyle changes. If you already have high cholesterol, you can control it with diet, lifestyle changes, and medicines. ?How can high cholesterol affect me? ?If you have high cholesterol, fatty deposits (plaques) may build up on the walls of your blood vessels. The blood vessels that carry blood away from your heart are called arteries. Plaques make the arteries narrower and stiffer. This in turn can: ?Restrict or block blood flow and cause blood clots to form. ?Increase your risk for heart attack and stroke. ?What can increase my risk for high cholesterol? ?This condition is more likely to develop in people who: ?Eat foods that are high in saturated fat or cholesterol. Saturated fat is mostly found in foods that come from animal sources. ?Are overweight. ?Are not getting enough exercise. ?Use products that contain nicotine or tobacco, such as cigarettes, e-cigarettes, and chewing tobacco. ?Have a family history of high cholesterol (familial hypercholesterolemia). ?What actions can I take to prevent this? ?Nutrition ? ?Eat less saturated fat. ?Avoid  trans fats (partially hydrogenated oils). These are often found in margarine and in some baked goods, fried foods, and snacks bought in packages. ?Avoid precooked or cured meat, such as bacon, sausages, or meat loaves. ?Avoid foods and drinks that have added sugars. ?Eat more fruits, vegetables, and whole grains. ?Choose healthy sources of protein, such as fish, poultry, lean cuts of red meat, beans, peas, lentils, and nuts. ?Choose healthy sources of fat, such as: ?Nuts. ?Vegetable oils, especially olive oil. ?Fish that have healthy fats, such as omega-3 fatty acids. These fish include mackerel or salmon. ?Lifestyle ?Lose weight if you are overweight. Maintaining a healthy body mass index (BMI) can help prevent or control high cholesterol. It can also lower your risk for diabetes and high blood pressure. Ask your health care provider to help you with a diet and exercise plan to lose weight safely. ?Do not use any products that contain nicotine or tobacco. These products include cigarettes, chewing tobacco, and vaping devices, such as e-cigarettes. If you need help quitting, ask your health care provider. ?Alcohol use ?Do not drink alcohol if: ?Your health care provider tells you not to drink. ?You are pregnant, may be pregnant, or are planning to become pregnant. ?If you drink alcohol: ?Limit how much you have to: ?0-1 drink a day for women. ?0-2 drinks a day for men. ?Know how much alcohol is in your drink. In the [place] one drink equals one 12 oz bottle of beer (355 mL), one 5 oz glass of wine (148 mL), or one 1? oz glass of hard liquor (44 mL). ?Activity ? ?Get enough exercise. Do exercises as told by your health care provider. ?Each week, do at  least 150 minutes of exercise that takes a medium level of effort (moderate-intensity exercise). This kind of exercise: ?Makes your heart beat faster while allowing you to still be able to talk. ?Can be done in short sessions several times a day or longer sessions a few  times a week. For example, on 5 days each week, you could walk fast or ride your bike 3 times a day for 10 minutes each time. ?Medicines ?Your health care provider may recommend medicines to help lower cholesterol. This may be a medicine to lower the amount of cholesterol that your liver makes. You may need medicine if: ?Diet and lifestyle changes have not lowered your cholesterol enough. ?You have high cholesterol and other risk factors for heart disease or stroke. ?Take over-the-counter and prescription medicines only as told by your health care provider. ?General information ?Manage your risk factors for high cholesterol. Talk with your health care provider about all your risk factors and how to lower your risk. ?Manage other conditions that you have, such as diabetes or high blood pressure (hypertension). ?Have blood tests to check your cholesterol levels at regular points in time as told by your health care provider. ?Keep all follow-up visits. This is important. ?Where to find more information ?American Heart Association: www.heart.org ?National Heart, Lung, and Blood Institute: https://wilson-eaton.com/ ?Summary ?High cholesterol increases your risk for heart disease and stroke. By keeping your cholesterol level low, you can reduce your risk for these conditions. ?High cholesterol can often be prevented with diet and lifestyle changes. ?Work with your health care provider to manage your risk factors, and have your blood tested regularly. ?This information is not intended to replace advice given to you by your health care provider. Make sure you discuss any questions you have with your health care provider. ?Document Revised: 04/16/2020 Document Reviewed: 04/16/2020 ?Elsevier Patient Education ? Center. ? ?

## 2021-06-18 NOTE — Assessment & Plan Note (Signed)
Wt otherwise stable. ?Consistency with healthy diet and physical activity encouraged, small changes at the time. ? ?

## 2021-06-18 NOTE — Assessment & Plan Note (Signed)
Reporting home BP's 120/70-80's. ?Continue Benicar-HCTZ 40-12.5 mg daily and low salt diet. ?Continue monitoring BP regularly. ?

## 2021-06-18 NOTE — Assessment & Plan Note (Signed)
Non pharmacologic treatment recommended for now. ?Further recommendations will be given according to 10 years CVD risk score and lipid panel numbers. ?

## 2021-07-23 ENCOUNTER — Other Ambulatory Visit (HOSPITAL_COMMUNITY): Payer: Self-pay

## 2021-08-20 ENCOUNTER — Other Ambulatory Visit (HOSPITAL_COMMUNITY): Payer: Self-pay

## 2021-09-11 ENCOUNTER — Other Ambulatory Visit (HOSPITAL_COMMUNITY): Payer: Self-pay

## 2021-09-17 ENCOUNTER — Other Ambulatory Visit (HOSPITAL_COMMUNITY): Payer: Self-pay

## 2021-10-10 DIAGNOSIS — R972 Elevated prostate specific antigen [PSA]: Secondary | ICD-10-CM | POA: Diagnosis not present

## 2021-10-17 ENCOUNTER — Other Ambulatory Visit (HOSPITAL_COMMUNITY): Payer: Self-pay

## 2021-10-17 DIAGNOSIS — N401 Enlarged prostate with lower urinary tract symptoms: Secondary | ICD-10-CM | POA: Diagnosis not present

## 2021-10-17 DIAGNOSIS — R3912 Poor urinary stream: Secondary | ICD-10-CM | POA: Diagnosis not present

## 2021-10-17 DIAGNOSIS — R972 Elevated prostate specific antigen [PSA]: Secondary | ICD-10-CM | POA: Diagnosis not present

## 2021-10-17 MED ORDER — SILODOSIN 8 MG PO CAPS
8.0000 mg | ORAL_CAPSULE | Freq: Every day | ORAL | 3 refills | Status: AC
  Filled 2021-10-17: qty 90, 90d supply, fill #0
  Filled 2022-01-10: qty 90, 90d supply, fill #1

## 2021-10-22 ENCOUNTER — Other Ambulatory Visit (HOSPITAL_COMMUNITY): Payer: Self-pay

## 2021-12-09 ENCOUNTER — Other Ambulatory Visit (HOSPITAL_COMMUNITY): Payer: Self-pay

## 2021-12-10 ENCOUNTER — Other Ambulatory Visit (HOSPITAL_COMMUNITY): Payer: Self-pay

## 2021-12-10 NOTE — Progress Notes (Signed)
ACUTE VISIT Chief Complaint  Patient presents with   Allergies    On & off, nasal allergies. Has been having to use inhaler.    HPI: Erik Rubio. is a 56 y.o. male with hx of HTN, HLD, cervical radiculopathy,and gout here today complaining of wheezing episodes since January.   He reports no history of asthma but mentions his mother had asthma.  He has done woodworking for three years, always wears an appropriate mask with air filter on his face.  He had a similar episode in April and used her wife's Albuterol inhaler, which provided relief. This summer, he experienced another episode with nasal congestion and difficulty breathing, and Albuterol inhaler helped alleviate the symptoms. Three days ago, he had another wheezing episode. Negative for fever,chills,or body aches.  He has not had associated cough but reports postnasal drainage and nasal congestion. He has had episodes at night that wake him up in the middle of the night.  No new medications. BP has been adequately controlled at home. He is on Olmesartan-HCTZ 40-12.5 mg  daily. Negative for CP,palpitations,or edema. Lab Results  Component Value Date   CREATININE 1.24 03/12/2021   BUN 17 03/12/2021   NA 136 03/12/2021   K 3.8 03/12/2021   CL 98 03/12/2021   CO2 28 03/12/2021   He has been using Flonase for nasal congestion but dislikes the sensation it causes in his throat. He is also taking Zyrtec 10 mg daily, which has not help much.  Review of Systems  Constitutional:  Negative for activity change and appetite change.  HENT:  Positive for rhinorrhea. Negative for ear pain, mouth sores, sneezing and sore throat.   Eyes:  Negative for discharge, redness and itching.  Respiratory:  Positive for chest tightness.   Gastrointestinal:  Negative for abdominal pain, nausea and vomiting.       No changes in bowel habits.  Skin:  Negative for rash.  Neurological:  Negative for weakness and headaches.  Rest  see pertinent positives and negatives per HPI.  Current Outpatient Medications on File Prior to Visit  Medication Sig Dispense Refill   allopurinol (ZYLOPRIM) 100 MG tablet Take 2 tablets (200 mg total) by mouth daily. 180 tablet 3   colchicine 0.6 MG tablet Take 1 tablet (0.6 mg total) by mouth 2 (two) times daily as needed. 30 tablet 1   olmesartan-hydrochlorothiazide (BENICAR HCT) 40-12.5 MG tablet Take 1 tablet by mouth daily. 90 tablet 2   sildenafil (REVATIO) 20 MG tablet Take 2 tablets (40 mg total) by mouth daily as needed. 60 tablet 1   silodosin (RAPAFLO) 8 MG CAPS capsule Take 1 capsule (8 mg total) by mouth daily. 90 capsule 3   No current facility-administered medications on file prior to visit.   Past Medical History:  Diagnosis Date   Arthritis    Knee OA   Hypertension    Allergies  Allergen Reactions   Ciprofloxacin Hives   Social History   Socioeconomic History   Marital status: Married    Spouse name: Not on file   Number of children: Not on file   Years of education: Not on file   Highest education level: Bachelor's degree (e.g., BA, AB, BS)  Occupational History   Not on file  Tobacco Use   Smoking status: Never   Smokeless tobacco: Never  Substance and Sexual Activity   Alcohol use: Yes    Comment: Occasional   Drug use: No   Sexual activity:  Not on file  Other Topics Concern   Not on file  Social History Narrative   Not on file   Social Determinants of Health   Financial Resource Strain: Low Risk  (04/04/2021)   Overall Financial Resource Strain (CARDIA)    Difficulty of Paying Living Expenses: Not hard at all  Food Insecurity: No Food Insecurity (04/04/2021)   Hunger Vital Sign    Worried About Running Out of Food in the Last Year: Never true    Ran Out of Food in the Last Year: Never true  Transportation Needs: No Transportation Needs (04/04/2021)   PRAPARE - Administrator, Civil Service (Medical): No    Lack of Transportation  (Non-Medical): No  Physical Activity: Sufficiently Active (04/04/2021)   Exercise Vital Sign    Days of Exercise per Week: 5 days    Minutes of Exercise per Session: 30 min  Stress: No Stress Concern Present (04/04/2021)   [employer] of Occupational Health - Occupational Stress Questionnaire    Feeling of Stress : Not at all  Social Connections: Socially Integrated (04/04/2021)   Social Connection and Isolation Panel [NHANES]    Frequency of Communication with Friends and Family: More than three times a week    Frequency of Social Gatherings with Friends and Family: More than three times a week    Attends Religious Services: More than 4 times per year    Active Member of [employer] or Organizations: Yes    Attends Banker Meetings: 1 to 4 times per year    Marital Status: Married   Vitals:   12/11/21 1006  BP: 126/80  Pulse: 86  Resp: 12  Temp: 98.8 F (37.1 C)  SpO2: 98%  Body mass index is 28.59 kg/m.  Physical Exam Vitals and nursing note reviewed.  Constitutional:      General: He is not in acute distress.    Appearance: He is well-developed. He is not ill-appearing.  HENT:     Head: Normocephalic and atraumatic.     Right Ear: Tympanic membrane, ear canal and external ear normal.     Left Ear: Tympanic membrane, ear canal and external ear normal.     Nose: Rhinorrhea present.     Right Turbinates: Enlarged.     Left Turbinates: Enlarged.     Left Sinus: No maxillary sinus tenderness or frontal sinus tenderness.     Mouth/Throat:     Mouth: Mucous membranes are moist.     Pharynx: Oropharynx is clear.  Eyes:     Conjunctiva/sclera: Conjunctivae normal.  Cardiovascular:     Rate and Rhythm: Normal rate and regular rhythm.     Heart sounds: No murmur heard. Pulmonary:     Effort: Pulmonary effort is normal. No respiratory distress.     Breath sounds: Normal breath sounds. No stridor.  Lymphadenopathy:     Head:     Right side of head: No submandibular  adenopathy.     Left side of head: No submandibular adenopathy.     Cervical: No cervical adenopathy.  Skin:    General: Skin is warm.     Findings: No erythema or rash.  Neurological:     Mental Status: He is alert and oriented to person, place, and time.  Psychiatric:        Mood and Affect: Mood and affect normal.   ASSESSMENT AND PLAN:  Mr.Merrik was seen today for allergies.  Diagnoses and all orders for this visit: Orders Placed  This Encounter  Procedures   DG Chest 2 View   Pulmonary Function Test   Wheezing We discussed possible etiologies. ? Asthma vs COPD, although there is no hx of tobacco use. Albuterol has helped and he does not have frequent episodes, so continue Albuterol inh as needed. CXR ordered today and appt for PFT's will be arranged. Instructed about warning signs. F/U in 4-6 weeks,before if needed.  -     albuterol (VENTOLIN HFA) 108 (90 Base) MCG/ACT inhaler; Inhale 2 puffs into the lungs every 6 (six) hours as needed for wheezing or shortness of breath.  Seasonal allergic rhinitis, unspecified trigger Flonase nasal spray has not helped much, so changed to Nasonex nasal spray daily as needed. Nasal saline irrigations as needed.  -     mometasone (NASONEX 24HR) 50 MCG/ACT nasal spray; Place 2 sprays into the nose daily.  Hypertension, essential, benign BP adequately controlled. Continue Olmesartan-HCTZ 40-12.5 mg daily and low salt diet.  Return in about 5 weeks (around 01/15/2022) for Please move CPE for 12/2021.Marland Kitchen  Norwood Quezada G. Martinique, MD  [facility]. [place] office.

## 2021-12-11 ENCOUNTER — Other Ambulatory Visit (HOSPITAL_COMMUNITY): Payer: Self-pay

## 2021-12-11 ENCOUNTER — Ambulatory Visit: Payer: 59 | Admitting: Family Medicine

## 2021-12-11 ENCOUNTER — Ambulatory Visit (INDEPENDENT_AMBULATORY_CARE_PROVIDER_SITE_OTHER): Payer: 59

## 2021-12-11 ENCOUNTER — Encounter: Payer: Self-pay | Admitting: Family Medicine

## 2021-12-11 VITALS — BP 126/80 | HR 86 | Temp 98.8°F | Resp 12 | Ht 70.0 in | Wt 199.2 lb

## 2021-12-11 DIAGNOSIS — R062 Wheezing: Secondary | ICD-10-CM

## 2021-12-11 DIAGNOSIS — J309 Allergic rhinitis, unspecified: Secondary | ICD-10-CM | POA: Insufficient documentation

## 2021-12-11 DIAGNOSIS — I10 Essential (primary) hypertension: Secondary | ICD-10-CM | POA: Diagnosis not present

## 2021-12-11 DIAGNOSIS — J302 Other seasonal allergic rhinitis: Secondary | ICD-10-CM | POA: Diagnosis not present

## 2021-12-11 MED ORDER — ALBUTEROL SULFATE HFA 108 (90 BASE) MCG/ACT IN AERS
2.0000 | INHALATION_SPRAY | Freq: Four times a day (QID) | RESPIRATORY_TRACT | 2 refills | Status: AC | PRN
  Filled 2021-12-11: qty 6.7, 25d supply, fill #0

## 2021-12-11 MED ORDER — MOMETASONE FUROATE 50 MCG/ACT NA SUSP
2.0000 | Freq: Every day | NASAL | 12 refills | Status: AC
  Filled 2021-12-11: qty 17, 60d supply, fill #0
  Filled 2021-12-11 – 2021-12-12 (×2): qty 17, 30d supply, fill #0

## 2021-12-11 NOTE — Patient Instructions (Signed)
A few things to remember from today's visit:  Wheezing - Plan: DG Chest 2 View, Pulmonary Function Test, albuterol (VENTOLIN HFA) 108 (90 Base) MCG/ACT inhaler  Seasonal allergic rhinitis, unspecified trigger - Plan: mometasone (NASONEX 24HR) 50 MCG/ACT nasal spray  There are 2 forms of allergic rhinitis: Seasonal (hay fever): Caused by an allergy to pollen and/or mold spores in the air. Pollen is the fine powder that comes from the stamen of flowering plants. It can be carried through the air and is easily inhaled. Symptoms are seasonal and usually occur in spring, late summer, and fall. Perennial: Caused by other allergens such as dust mites, pet hair or dander, or mold. Symptoms occur year-round.  Symptoms: Your symptoms can vary, depending on the severity of your allergies. Symptoms can include: Sneezing, coughing.itching (mostly eyes, nose, mouth, throat and skin),runny nose,stuffy nose.headache,pressure in the nose and cheeks,ear fullness and popping, sore throat.watery, red, or swollen eyes,dark circles under your eyes,trouble smelling, and sometimes hives.  Allergic rhinitis cannot be prevented. You can help your symptoms by avoiding the things that you are allergic, including: Keeping windows closed. This is especially important during high-pollen seasons. Washing your hands after petting animals. Using dust- and mite-proof bedding and mattress covers. Wearing glasses outside to protect your eyes. Showering before bed to wash off allergens from hair and skin. You can also avoid things that can make your symptoms worse, such as: aerosol sprays air pollution cold temperatures humidity irritating fumes tobacco smoke wind wood smoke.  Antihistamines help reduce the sneezing, runny nose, and itchiness of allergies. These come in pill form and as nasal sprays. Allegra,Zyrtec,or Claritin are some examples. Decongestants, such as pseudoephedrine and phenylephrine, help temporarily  relieve the stuffy nose of allergies. Decongestants are found in many medicines and come as pills, nose sprays, and nose drops. They could increase heart rate and cause tachycardia and tremor. Nasal Afrin should not be used for more than 3 days because you can become dependent on them. This causes you to feel even more stopped-up when you try to quit using them.  Nasal sprays: steroids or antihistaminics. Over the counter intranasal sterids: Nasocort,Rhinocort,or Flonase.You won't notice their benefits for up to 2 weeks after starting them. Allergy shots or sublingual tablets when other treatment do not help.This is done by immunologists.  If you need refills for medications you take chronically, please call your pharmacy. Do not use My Chart to request refills or for acute issues that need immediate attention. If you send a my chart message, it may take a few days to be addressed, specially if I am not in the office.  Please be sure medication list is accurate. If a new problem present, please set up appointment sooner than planned today.

## 2021-12-12 ENCOUNTER — Other Ambulatory Visit (HOSPITAL_COMMUNITY): Payer: Self-pay

## 2021-12-24 ENCOUNTER — Encounter: Payer: 59 | Admitting: Family Medicine

## 2022-01-10 ENCOUNTER — Other Ambulatory Visit: Payer: Self-pay | Admitting: Family Medicine

## 2022-01-10 ENCOUNTER — Other Ambulatory Visit (HOSPITAL_COMMUNITY): Payer: Self-pay

## 2022-01-10 DIAGNOSIS — I10 Essential (primary) hypertension: Secondary | ICD-10-CM

## 2022-01-10 MED ORDER — OLMESARTAN MEDOXOMIL-HCTZ 40-12.5 MG PO TABS
1.0000 | ORAL_TABLET | Freq: Every day | ORAL | 2 refills | Status: DC
  Filled 2022-01-10: qty 90, 90d supply, fill #0
  Filled 2022-10-23 – 2022-10-28 (×2): qty 90, 90d supply, fill #1

## 2022-01-20 NOTE — Progress Notes (Unsigned)
HPI: [name]. is a 56 y.o.male with medical hx significant for gout,BPH,HTN,ED,and HLD here today for his routine physical examination.  Last CPE: 12/18/20  He reports engaging in gentle exercise, such as tai chi a few times per week and walking approximately once a week.  He states that he consumes a diet consisting mainly of chicken, fish, and vegetables, with minimal red meat intake.  He sleeps an average of six to eight hours per night and denies smoking or alcohol consumption.  Immunization History  Administered Date(s) Administered   Influenza,inj,Quad PF,6+ Mos 11/23/2017, 11/30/2018, 12/13/2019, 12/18/2020, 01/21/2022   Influenza-Unspecified 11/18/2016   Moderna SARS-COV2 Booster Vaccination 06/20/2019, 01/24/2020, 06/04/2020   Moderna Sars-Covid-2 Vaccination 05/23/2019   Tdap 12/13/2019   Zoster Recombinat (Shingrix) 01/21/2022   Health Maintenance  Topic Date Due   COLONOSCOPY (Pts 45-22yr Insurance coverage will need to be confirmed)  Never done   COVID-19 Vaccine (5 - 2023-24 season) 02/06/2022 (Originally 10/25/2021)   Zoster Vaccines- Shingrix (2 of 2) 03/18/2022   DTaP/Tdap/Td (2 - Td or Tdap) 12/12/2029   INFLUENZA VACCINE  Completed   Hepatitis C Screening  Completed   HIV Screening  Completed   HPV VACCINES  Aged Out   BPH: Follows with urologist. ED on Sildenafil 20 mg 2 tabs daily, he wonders if dose can be increased. Tolerating medication well with no side effects.  HTN on Olmesartan-HCTZ 40-12.5 mg daily.  HLD on non pharmacologic treatment.  He was seen on 12/11/21 for wheezing, Albuterol inh has helped and problem has greatly improved since he stopped working with wood, it was exacerbating respiratory symptoms. For allergic rhinitis he used Nasonex nasal spray, about 6 days after use he started with spinning like sensation, exacerbated by head movement, lasted 2 weeks. No associated symptoms.  Review of Systems   Constitutional:  Negative for activity change, appetite change and fever.  HENT:  Negative for mouth sores, nosebleeds, sore throat and trouble swallowing.   Eyes:  Negative for redness and visual disturbance.  Respiratory:  Negative for cough and shortness of breath.   Cardiovascular:  Negative for chest pain, palpitations and leg swelling.  Gastrointestinal:  Negative for abdominal pain, blood in stool, nausea and vomiting.  Endocrine: Negative for cold intolerance, heat intolerance, polydipsia, polyphagia and polyuria.  Genitourinary:  Negative for decreased urine volume, dysuria, genital sores, hematuria and testicular pain.  Musculoskeletal:  Negative for gait problem and myalgias.  Skin:  Negative for color change and rash.  Allergic/Immunologic: Positive for environmental allergies.  Neurological:  Negative for syncope, weakness and headaches.  Hematological:  Negative for adenopathy. Does not bruise/bleed easily.  Psychiatric/Behavioral:  Negative for behavioral problems and confusion.   All other systems reviewed and are negative.  Current Outpatient Medications on File Prior to Visit  Medication Sig Dispense Refill   albuterol (VENTOLIN HFA) 108 (90 Base) MCG/ACT inhaler Inhale 2 puffs into the lungs every 6 (six) hours as needed for wheezing or shortness of breath. 6.7 g 2   allopurinol (ZYLOPRIM) 100 MG tablet Take 2 tablets (200 mg total) by mouth daily. 180 tablet 3   colchicine 0.6 MG tablet Take 1 tablet (0.6 mg total) by mouth 2 (two) times daily as needed. 30 tablet 1   mometasone (NASONEX 24HR) 50 MCG/ACT nasal spray Place 2 sprays into the nose daily. 17 g 12   olmesartan-hydrochlorothiazide (BENICAR HCT) 40-12.5 MG tablet Take 1 tablet by mouth daily. 90 tablet 2   sildenafil (REVATIO)  20 MG tablet Take 2 tablets (40 mg total) by mouth daily as needed. 60 tablet 1   silodosin (RAPAFLO) 8 MG CAPS capsule Take 1 capsule (8 mg total) by mouth daily. 90 capsule 3   No  current facility-administered medications on file prior to visit.    Past Medical History:  Diagnosis Date   Arthritis    Knee OA   Hypertension     Past Surgical History:  Procedure Laterality Date   CHOLECYSTECTOMY  2003    Allergies  Allergen Reactions   Ciprofloxacin Hives   Family History  Problem Relation Age of Onset   Stroke Mother        aneurysm   Hypertension Mother    Hypertension Father     Social History   Socioeconomic History   Marital status: Married    Spouse name: Not on file   Number of children: Not on file   Years of education: Not on file   Highest education level: Bachelor's degree (e.g., BA, AB, BS)  Occupational History   Not on file  Tobacco Use   Smoking status: Never   Smokeless tobacco: Never  Substance and Sexual Activity   Alcohol use: Yes    Comment: Occasional   Drug use: No   Sexual activity: Not on file  Other Topics Concern   Not on file  Social History Narrative   Not on file   Social Determinants of Health   Financial Resource Strain: Low Risk  (04/04/2021)   Overall Financial Resource Strain (CARDIA)    Difficulty of Paying Living Expenses: Not hard at all  Food Insecurity: No Food Insecurity (04/04/2021)   Hunger Vital Sign    Worried About Running Out of Food in the Last Year: Never true    Ran Out of Food in the Last Year: Never true  Transportation Needs: No Transportation Needs (04/04/2021)   PRAPARE - Hydrologist (Medical): No    Lack of Transportation (Non-Medical): No  Physical Activity: Sufficiently Active (04/04/2021)   Exercise Vital Sign    Days of Exercise per Week: 5 days    Minutes of Exercise per Session: 30 min  Stress: No Stress Concern Present (04/04/2021)   [place]    Feeling of Stress : Not at all  Social Connections: [place] (04/04/2021)   Social Connection and Isolation Panel [NHANES]     Frequency of Communication with Friends and Family: More than three times a week    Frequency of Social Gatherings with Friends and Family: More than three times a week    Attends Religious Services: More than 4 times per year    Active Member of Clubs or Organizations: Yes    Attends Archivist Meetings: 1 to 4 times per year    Marital Status: Married   Vitals:   01/21/22 0747  BP: 120/70  Pulse: 87  Resp: 12  Temp: 98.6 F (37 C)  SpO2: 97%   Body mass index is 28.27 kg/m.  Wt Readings from Last 3 Encounters:  01/21/22 197 lb (89.4 kg)  12/11/21 199 lb 4 oz (90.4 kg)  06/18/21 199 lb 6 oz (90.4 kg)   Physical Exam Vitals and nursing note reviewed.  Constitutional:      General: He is not in acute distress.    Appearance: He is well-developed.  HENT:     Head: Normocephalic and atraumatic.  Right Ear: Tympanic membrane, ear canal and external ear normal.     Left Ear: Tympanic membrane, ear canal and external ear normal.     Mouth/Throat:     Mouth: Mucous membranes are moist.     Pharynx: Oropharynx is clear.  Eyes:     Extraocular Movements: Extraocular movements intact.     Conjunctiva/sclera: Conjunctivae normal.     Pupils: Pupils are equal, round, and reactive to light.  Neck:     Thyroid: No thyroid mass.  Cardiovascular:     Rate and Rhythm: Normal rate and regular rhythm.     Pulses:          Dorsalis pedis pulses are 2+ on the right side and 2+ on the left side.     Heart sounds: No murmur heard. Pulmonary:     Effort: Pulmonary effort is normal. No respiratory distress.     Breath sounds: Normal breath sounds.  Abdominal:     Palpations: Abdomen is soft. There is no hepatomegaly or mass.     Tenderness: There is no abdominal tenderness.  Genitourinary:    Comments: No concerns. Musculoskeletal:        General: No tenderness.     Cervical back: Normal range of motion.     Comments: No signs of synovitis.  Lymphadenopathy:      Cervical: No cervical adenopathy.     Upper Body:     Right upper body: No supraclavicular adenopathy.     Left upper body: No supraclavicular adenopathy.  Skin:    General: Skin is warm.     Findings: No erythema.  Neurological:     General: No focal deficit present.     Mental Status: He is alert and oriented to person, place, and time.     Cranial Nerves: No cranial nerve deficit.     Sensory: No sensory deficit.     Motor: No weakness or tremor.     Gait: Gait normal.     Deep Tendon Reflexes:     Reflex Scores:      Bicep reflexes are 2+ on the right side and 2+ on the left side.      Patellar reflexes are 2+ on the right side and 2+ on the left side. Psychiatric:        Mood and Affect: Mood and affect normal.   ASSESSMENT AND PLAN:  [name] was seen today for annual exam.  Diagnoses and all orders for this visit:  Orders Placed This Encounter  Procedures   Flu Vaccine QUAD 4moIM (Fluarix, Fluzone & Alfiuria Quad PF)   Varicella-zoster vaccine IM   Lipid panel   Comprehensive metabolic panel   Hemoglobin A1c   LDL cholesterol, direct   Lab Results  Component Value Date   CHOL 229 (H) 01/21/2022   HDL 51.50 01/21/2022   LDLCALC 139 (H) 06/18/2021   LDLDIRECT 129.0 01/21/2022   TRIG 214.0 (H) 01/21/2022   CHOLHDL 4 01/21/2022   Lab Results  Component Value Date   CREATININE 1.19 01/21/2022   BUN 16 01/21/2022   NA 138 01/21/2022   K 4.2 01/21/2022   CL 101 01/21/2022   CO2 27 01/21/2022   Lab Results  Component Value Date   ALT 41 01/21/2022   AST 32 01/21/2022   ALKPHOS 77 01/21/2022   BILITOT 0.6 01/21/2022   Lab Results  Component Value Date   HGBA1C 5.7 01/21/2022   The 10-year ASCVD risk score (Arnett DK, et al., 2019)  is: 6.1%   Values used to calculate the score:     Age: 61 years     Sex: Male     Is Non-Hispanic African American: No     Diabetic: No     Tobacco smoker: No     Systolic Blood Pressure: 160 mmHg     Is BP  treated: Yes     HDL Cholesterol: 51.5 mg/dL     Total Cholesterol: 229 mg/dL  Routine general medical examination at a health care facility Assessment & Plan: We discussed the importance of regular physical activity and healthy diet for prevention of chronic illness and/or complications. Preventive guidelines reviewed. Vaccination updated. Cologuard 12/2020 negative. Next CPE in a year.   Hyperlipidemia, unspecified hyperlipidemia type Assessment & Plan: Non pharmacologic treatment to continue for now. Further recommendations will be given according to 10 years CVD risk score and lipid panel numbers.  Orders: -     Lipid panel; Future -     Comprehensive metabolic panel; Future  Hypertension, essential, benign Assessment & Plan: BP adequately controlled. Continue Benicar-HCTZ 40-12.5 mg daily and low salt diet. Continue monitoring BP regularly.  Orders: -     Comprehensive metabolic panel; Future  Screening for endocrine, metabolic and immunity disorder -     Hemoglobin A1c; Future  Benign paroxysmal positional vertigo, unspecified laterality Assessment & Plan: Hx suggest vertigo. We discussed Dx,prognosis,and treatment options. It has improved. Fall precautions discussed.   Erectile dysfunction, unspecified erectile dysfunction type Assessment & Plan: Sildenafil 40 mg is not helping, he can increase dose to 60 mg,80 mg,and 100 mg max if needed. Some side effects discussed. He can follow with urologist if problem does not improve with max dose of medication.   Need for influenza vaccination -     Flu Vaccine QUAD 48moIM (Fluarix, Fluzone & Alfiuria Quad PF)  Need for shingles vaccine -     Varicella-zoster vaccine IM  Other orders -     LDL cholesterol, direct   Return in 6 months (on 07/22/2022) for chronic problems.  Tehillah Cipriani G. JMartinique MD  [facility] [place].

## 2022-01-21 ENCOUNTER — Encounter: Payer: Self-pay | Admitting: Family Medicine

## 2022-01-21 ENCOUNTER — Ambulatory Visit (INDEPENDENT_AMBULATORY_CARE_PROVIDER_SITE_OTHER): Payer: 59 | Admitting: Family Medicine

## 2022-01-21 VITALS — BP 120/70 | HR 87 | Temp 98.6°F | Resp 12 | Ht 70.0 in | Wt 197.0 lb

## 2022-01-21 DIAGNOSIS — E785 Hyperlipidemia, unspecified: Secondary | ICD-10-CM | POA: Diagnosis not present

## 2022-01-21 DIAGNOSIS — Z23 Encounter for immunization: Secondary | ICD-10-CM

## 2022-01-21 DIAGNOSIS — Z1329 Encounter for screening for other suspected endocrine disorder: Secondary | ICD-10-CM | POA: Diagnosis not present

## 2022-01-21 DIAGNOSIS — I10 Essential (primary) hypertension: Secondary | ICD-10-CM | POA: Diagnosis not present

## 2022-01-21 DIAGNOSIS — Z13228 Encounter for screening for other metabolic disorders: Secondary | ICD-10-CM | POA: Diagnosis not present

## 2022-01-21 DIAGNOSIS — H8110 Benign paroxysmal vertigo, unspecified ear: Secondary | ICD-10-CM | POA: Diagnosis not present

## 2022-01-21 DIAGNOSIS — N529 Male erectile dysfunction, unspecified: Secondary | ICD-10-CM

## 2022-01-21 DIAGNOSIS — Z130 Encounter for screening for diseases of the blood and blood-forming organs and certain disorders involving the immune mechanism: Secondary | ICD-10-CM | POA: Diagnosis not present

## 2022-01-21 DIAGNOSIS — Z0000 Encounter for general adult medical examination without abnormal findings: Secondary | ICD-10-CM

## 2022-01-21 LAB — COMPREHENSIVE METABOLIC PANEL
ALT: 41 U/L (ref 0–53)
AST: 32 U/L (ref 0–37)
Albumin: 4.9 g/dL (ref 3.5–5.2)
Alkaline Phosphatase: 77 U/L (ref 39–117)
BUN: 16 mg/dL (ref 6–23)
CO2: 27 mEq/L (ref 19–32)
Calcium: 10 mg/dL (ref 8.4–10.5)
Chloride: 101 mEq/L (ref 96–112)
Creatinine, Ser: 1.19 mg/dL (ref 0.40–1.50)
GFR: 69.44 mL/min (ref >60.00)
Glucose, Bld: 99 mg/dL (ref 70–99)
Potassium: 4.2 mEq/L (ref 3.5–5.1)
Sodium: 138 mEq/L (ref 135–145)
Total Bilirubin: 0.6 mg/dL (ref 0.2–1.2)
Total Protein: 8.3 g/dL (ref 6.0–8.3)

## 2022-01-21 LAB — LIPID PANEL
Cholesterol: 229 mg/dL — ABNORMAL HIGH (ref 0–200)
HDL: 51.5 mg/dL (ref >39.00)
NonHDL: 177.32
Total CHOL/HDL Ratio: 4
Triglycerides: 214 mg/dL — ABNORMAL HIGH (ref 0.0–149.0)
VLDL: 42.8 mg/dL — ABNORMAL HIGH (ref 0.0–40.0)

## 2022-01-21 LAB — HEMOGLOBIN A1C: Hgb A1c MFr Bld: 5.7 % (ref 4.6–6.5)

## 2022-01-21 LAB — LDL CHOLESTEROL, DIRECT: Direct LDL: 129 mg/dL

## 2022-01-21 NOTE — Patient Instructions (Addendum)
A few things to remember from today's visit:  Routine general medical examination at a health care facility  Hyperlipidemia, unspecified hyperlipidemia type - Plan: Lipid panel, Comprehensive metabolic panel  Hypertension, essential, benign - Plan: Comprehensive metabolic panel  Screening for endocrine, metabolic and immunity disorder - Plan: Hemoglobin A1c  Today you received flu and shingles vaccine.  If you need refills for medications you take chronically, please call your pharmacy. Do not use My Chart to request refills or for acute issues that need immediate attention. If you send a my chart message, it may take a few days to be addressed, specially if I am not in the office.  Please be sure medication list is accurate. If a new problem present, please set up appointment sooner than planned today.  Health Maintenance, Male Adopting a healthy lifestyle and getting preventive care are important in promoting health and wellness. Ask your health care provider about: The right schedule for you to have regular tests and exams. Things you can do on your own to prevent diseases and keep yourself healthy. What should I know about diet, weight, and exercise? Eat a healthy diet  Eat a diet that includes plenty of vegetables, fruits, low-fat dairy products, and lean protein. Do not eat a lot of foods that are high in solid fats, added sugars, or sodium. Maintain a healthy weight Body mass index (BMI) is a measurement that can be used to identify possible weight problems. It estimates body fat based on height and weight. Your health care provider can help determine your BMI and help you achieve or maintain a healthy weight. Get regular exercise Get regular exercise. This is one of the most important things you can do for your health. Most adults should: Exercise for at least 150 minutes each week. The exercise should increase your heart rate and make you sweat (moderate-intensity  exercise). Do strengthening exercises at least twice a week. This is in addition to the moderate-intensity exercise. Spend less time sitting. Even light physical activity can be beneficial. Watch cholesterol and blood lipids Have your blood tested for lipids and cholesterol at 54 years of age, then have this test every 5 years. You may need to have your cholesterol levels checked more often if: Your lipid or cholesterol levels are high. You are older than 54 years of age. You are at high risk for heart disease. What should I know about cancer screening? Many types of cancers can be detected early and may often be prevented. Depending on your health history and family history, you may need to have cancer screening at various ages. This may include screening for: Colorectal cancer. Prostate cancer. Skin cancer. Lung cancer. What should I know about heart disease, diabetes, and high blood pressure? Blood pressure and heart disease High blood pressure causes heart disease and increases the risk of stroke. This is more likely to develop in people who have high blood pressure readings or are overweight. Talk with your health care provider about your target blood pressure readings. Have your blood pressure checked: Every 3-5 years if you are 16-8 years of age. Every year if you are 85 years old or older. If you are between the ages of 21 and 72 and are a current or former smoker, ask your health care provider if you should have a one-time screening for abdominal aortic aneurysm (AAA). Diabetes Have regular diabetes screenings. This checks your fasting blood sugar level. Have the screening done: Once every three years after age 67  if you are at a normal weight and have a low risk for diabetes. More often and at a younger age if you are overweight or have a high risk for diabetes. What should I know about preventing infection? Hepatitis B If you have a higher risk for hepatitis B, you should be  screened for this virus. Talk with your health care provider to find out if you are at risk for hepatitis B infection. Hepatitis C Blood testing is recommended for: Everyone born from [birth year] through 1963. Anyone with known risk factors for hepatitis C. Sexually transmitted infections (STIs) You should be screened each year for STIs, including gonorrhea and chlamydia, if: You are sexually active and are younger than 54 years of age. You are older than 54 years of age and your health care provider tells you that you are at risk for this type of infection. Your sexual activity has changed since you were last screened, and you are at increased risk for chlamydia or gonorrhea. Ask your health care provider if you are at risk. Ask your health care provider about whether you are at high risk for HIV. Your health care provider may recommend a prescription medicine to help prevent HIV infection. If you choose to take medicine to prevent HIV, you should first get tested for HIV. You should then be tested every 3 months for as long as you are taking the medicine. Follow these instructions at home: Alcohol use Do not drink alcohol if your health care provider tells you not to drink. If you drink alcohol: Limit how much you have to 0-2 drinks a day. Know how much alcohol is in your drink. In the [place] one drink equals one 12 oz bottle of beer (355 mL), one 5 oz glass of wine (148 mL), or one 1 oz glass of hard liquor (44 mL). Lifestyle Do not use any products that contain nicotine or tobacco. These products include cigarettes, chewing tobacco, and vaping devices, such as e-cigarettes. If you need help quitting, ask your health care provider. Do not use street drugs. Do not share needles. Ask your health care provider for help if you need support or information about quitting drugs. General instructions Schedule regular health, dental, and eye exams. Stay current with your vaccines. Tell your health care  provider if: You often feel depressed. You have ever been abused or do not feel safe at home. Summary Adopting a healthy lifestyle and getting preventive care are important in promoting health and wellness. Follow your health care provider's instructions about healthy diet, exercising, and getting tested or screened for diseases. Follow your health care provider's instructions on monitoring your cholesterol and blood pressure. This information is not intended to replace advice given to you by your health care provider. Make sure you discuss any questions you have with your health care provider. Document Revised: 07/02/2020 Document Reviewed: 07/02/2020 Elsevier Patient Education  2023 [employer].

## 2022-01-23 DIAGNOSIS — Z0000 Encounter for general adult medical examination without abnormal findings: Secondary | ICD-10-CM | POA: Insufficient documentation

## 2022-01-23 NOTE — Assessment & Plan Note (Signed)
BP adequately controlled. Continue Benicar-HCTZ 40-12.5 mg daily and low salt diet. Continue monitoring BP regularly.

## 2022-01-23 NOTE — Assessment & Plan Note (Signed)
Non pharmacologic treatment to continue for now. Further recommendations will be given according to 10 years CVD risk score and lipid panel numbers.  

## 2022-01-23 NOTE — Assessment & Plan Note (Signed)
Hx suggest vertigo. We discussed Dx,prognosis,and treatment options. It has improved. Fall precautions discussed.

## 2022-01-23 NOTE — Assessment & Plan Note (Signed)
Sildenafil 40 mg is not helping, he can increase dose to 60 mg,80 mg,and 100 mg max if needed. Some side effects discussed. He can follow with urologist if problem does not improve with max dose of medication.

## 2022-01-23 NOTE — Assessment & Plan Note (Addendum)
We discussed the importance of regular physical activity and healthy diet for prevention of chronic illness and/or complications. Preventive guidelines reviewed. Vaccination updated. Cologuard 12/2020 negative. Next CPE in a year.

## 2022-02-16 ENCOUNTER — Encounter (HOSPITAL_COMMUNITY): Payer: Self-pay | Admitting: Emergency Medicine

## 2022-02-16 ENCOUNTER — Ambulatory Visit (HOSPITAL_COMMUNITY)
Admission: EM | Admit: 2022-02-16 | Discharge: 2022-02-16 | Disposition: A | Discharge status: Home / Self Care | Payer: 59 | Attending: Physician Assistant | Admitting: Physician Assistant

## 2022-02-16 DIAGNOSIS — M542 Cervicalgia: Secondary | ICD-10-CM | POA: Diagnosis not present

## 2022-02-16 DIAGNOSIS — M25511 Pain in right shoulder: Secondary | ICD-10-CM

## 2022-02-16 DIAGNOSIS — T148XXA Other injury of unspecified body region, initial encounter: Secondary | ICD-10-CM

## 2022-02-16 MED ORDER — KETOROLAC TROMETHAMINE 30 MG/ML IJ SOLN
30.0000 mg | Freq: Once | INTRAMUSCULAR | Status: AC
  Administered 2022-02-16: 30 mg via INTRAMUSCULAR

## 2022-02-16 MED ORDER — PREDNISONE 10 MG PO TABS: 10.0000 mg | ORAL_TABLET | Freq: Three times a day (TID) | ORAL | 0 refills | Status: DC

## 2022-02-16 MED ORDER — GABAPENTIN 300 MG PO CAPS
300.0000 mg | ORAL_CAPSULE | Freq: Three times a day (TID) | ORAL | 0 refills | Status: DC
Start: 2022-02-16 — End: 2022-02-20

## 2022-02-16 MED ORDER — TIZANIDINE HCL 4 MG PO TABS: 4.0000 mg | ORAL_TABLET | Freq: Four times a day (QID) | ORAL | 0 refills | Status: DC | PRN

## 2022-02-16 MED ORDER — KETOROLAC TROMETHAMINE 30 MG/ML IJ SOLN
INTRAMUSCULAR | Status: AC
  Filled 2022-02-16: qty 1
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
## 2022-02-16 NOTE — ED Triage Notes (Signed)
Hx of spinal stenosis w epidurals. Lifted something heavy one week ago, began having a back flare yesterday. Pain currently starting a middle back, moving up into right shoulder with radiation into right elbow. Denies fever, numbness, tingling, weakness

## 2022-02-16 NOTE — Discharge Instructions (Signed)
Advised to take the Zanaflex every 6 hours on a regular basis to help reduce muscle spasm. Advised take the prednisone 10 mg 3 times a day for 5 days only to help reduce the acute inflammatory process. Advised take the extra strength Tylenol to help reduce pain. Advised to take the Neurontin 300 mg 3 times a day to help reduce the acute nerve inflammation and pain. Continue to use ice therapy, 10 minutes on 20 minutes off, 4-5 times throughout the day to help reduce pain and discomfort. Advised follow-up PCP or return to urgent care if symptoms fail to improve.

## 2022-02-19 ENCOUNTER — Telehealth: Payer: Self-pay | Admitting: Physical Medicine and Rehabilitation

## 2022-02-19 NOTE — Telephone Encounter (Signed)
Patient requesting ED follow up visit please advise

## 2022-02-20 ENCOUNTER — Encounter: Payer: Self-pay | Admitting: Physical Medicine and Rehabilitation

## 2022-02-20 ENCOUNTER — Ambulatory Visit: Payer: 59 | Admitting: Physical Medicine and Rehabilitation

## 2022-02-20 DIAGNOSIS — M542 Cervicalgia: Secondary | ICD-10-CM | POA: Diagnosis not present

## 2022-02-20 DIAGNOSIS — M5412 Radiculopathy, cervical region: Secondary | ICD-10-CM | POA: Diagnosis not present

## 2022-02-20 DIAGNOSIS — M4802 Spinal stenosis, cervical region: Secondary | ICD-10-CM | POA: Diagnosis not present

## 2022-02-20 DIAGNOSIS — M7918 Myalgia, other site: Secondary | ICD-10-CM | POA: Diagnosis not present

## 2022-02-20 DIAGNOSIS — M5010 Cervical disc disorder with radiculopathy, unspecified cervical region: Secondary | ICD-10-CM

## 2022-02-20 MED ORDER — GABAPENTIN 300 MG PO CAPS: 300.0000 mg | ORAL_CAPSULE | Freq: Three times a day (TID) | ORAL | 0 refills | Status: DC

## 2022-02-20 MED ORDER — TRAMADOL HCL 50 MG PO TABS: 50.0000 mg | ORAL_TABLET | Freq: Three times a day (TID) | ORAL | 0 refills | Status: DC | PRN

## 2022-02-20 MED ORDER — TIZANIDINE HCL 4 MG PO TABS: 4.0000 mg | ORAL_TABLET | Freq: Four times a day (QID) | ORAL | 0 refills | Status: DC | PRN
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
## 2022-02-20 NOTE — Progress Notes (Signed)
Functional Pain Scale - descriptive words and definitions  Distracting (5)    Aware of pain/able to complete some ADL's but limited by pain/sleep is affected and active distractions are only slightly useful. Moderate range order  Average Pain  ranges from 5-7  Neck and right shoulder pain that radiates down the arm. Tingling in right thumb and middle finger

## 2022-02-21 ENCOUNTER — Other Ambulatory Visit (HOSPITAL_COMMUNITY): Payer: Self-pay

## 2022-02-21 ENCOUNTER — Telehealth: Payer: Self-pay | Admitting: Physical Medicine and Rehabilitation

## 2022-02-21 MED ORDER — GABAPENTIN 300 MG PO CAPS
300.0000 mg | ORAL_CAPSULE | Freq: Three times a day (TID) | ORAL | 0 refills | Status: DC
  Filled 2022-02-21: qty 90, 30d supply, fill #0

## 2022-02-21 MED ORDER — TIZANIDINE HCL 4 MG PO TABS
4.0000 mg | ORAL_TABLET | Freq: Four times a day (QID) | ORAL | 0 refills | Status: DC | PRN
  Filled 2022-02-21: qty 30, 8d supply, fill #0

## 2022-02-21 NOTE — Telephone Encounter (Signed)
Medication need to be sent to [facility] on [address], patient states his insurance will not pay at [employer]. Please advise..[phone].. Medications are: Gabapentin, Tizanidine.Erik Rubio

## 2022-02-23 ENCOUNTER — Other Ambulatory Visit (HOSPITAL_COMMUNITY): Payer: Self-pay

## 2022-02-25 ENCOUNTER — Other Ambulatory Visit (HOSPITAL_COMMUNITY): Payer: Self-pay

## 2022-02-26 ENCOUNTER — Other Ambulatory Visit: Payer: Self-pay | Admitting: Physical Medicine and Rehabilitation

## 2022-02-26 ENCOUNTER — Other Ambulatory Visit (HOSPITAL_COMMUNITY): Payer: Self-pay

## 2022-02-26 ENCOUNTER — Other Ambulatory Visit: Payer: Self-pay

## 2022-02-26 MED ORDER — GABAPENTIN 300 MG PO CAPS
300.0000 mg | ORAL_CAPSULE | Freq: Three times a day (TID) | ORAL | 0 refills | Status: AC
  Filled 2022-02-26: qty 90, 30d supply, fill #0

## 2022-02-26 MED ORDER — TIZANIDINE HCL 4 MG PO TABS
4.0000 mg | ORAL_TABLET | Freq: Four times a day (QID) | ORAL | 0 refills | Status: DC | PRN
  Filled 2022-02-26 – 2022-02-28 (×2): qty 30, 8d supply, fill #0

## 2022-02-27 ENCOUNTER — Other Ambulatory Visit: Payer: Self-pay | Admitting: Physical Medicine and Rehabilitation

## 2022-02-28 ENCOUNTER — Other Ambulatory Visit (HOSPITAL_COMMUNITY): Payer: Self-pay

## 2022-02-28 ENCOUNTER — Other Ambulatory Visit: Payer: Self-pay | Admitting: Physical Medicine and Rehabilitation

## 2022-03-01 NOTE — Telephone Encounter (Signed)
Erik Rubio/Erik Rubio patient

## 2022-03-03 ENCOUNTER — Ambulatory Visit
Admission: RE | Admit: 2022-03-03 | Discharge: 2022-03-03 | Disposition: A | Discharge status: Home / Self Care | Payer: Commercial Managed Care - PPO | Source: Ambulatory Visit | Attending: Physical Medicine and Rehabilitation | Admitting: Physical Medicine and Rehabilitation

## 2022-03-03 DIAGNOSIS — M4802 Spinal stenosis, cervical region: Secondary | ICD-10-CM | POA: Diagnosis not present

## 2022-03-04 ENCOUNTER — Other Ambulatory Visit: Payer: Self-pay | Admitting: Physical Medicine and Rehabilitation

## 2022-03-04 DIAGNOSIS — M5412 Radiculopathy, cervical region: Secondary | ICD-10-CM

## 2022-03-04 NOTE — Progress Notes (Signed)
Spoke with patient via telephone this morning to discuss results of recent cervical MRI imaging. There are multi level degenerative changes, right paracentral disc extrusion at C5-C6 thought to be symptomatic level, there is also central to left paracentral disc protrusion at C3-C4 resulting in moderate spinal canal stenosis. Patient has no questions regarding results. Myself and Dr. Ernestina Patches would like to proceed with right sided injection. I placed orders for right C7-T1 interlaminar epidural steroid injection. We will call patient to schedule.

## 2022-03-06 ENCOUNTER — Other Ambulatory Visit (HOSPITAL_COMMUNITY): Payer: Self-pay

## 2022-03-11 ENCOUNTER — Other Ambulatory Visit (HOSPITAL_COMMUNITY): Payer: Self-pay

## 2022-03-11 ENCOUNTER — Ambulatory Visit (INDEPENDENT_AMBULATORY_CARE_PROVIDER_SITE_OTHER): Payer: Commercial Managed Care - PPO | Admitting: Physical Medicine and Rehabilitation

## 2022-03-11 ENCOUNTER — Ambulatory Visit: Payer: Self-pay

## 2022-03-11 VITALS — BP 168/96 | HR 101

## 2022-03-11 DIAGNOSIS — M5412 Radiculopathy, cervical region: Secondary | ICD-10-CM | POA: Diagnosis not present

## 2022-03-11 MED ORDER — TIZANIDINE HCL 4 MG PO TABS
4.0000 mg | ORAL_TABLET | Freq: Four times a day (QID) | ORAL | 0 refills | Status: AC | PRN
  Filled 2022-03-11: qty 30, 8d supply, fill #0

## 2022-03-11 MED ORDER — METHYLPREDNISOLONE ACETATE 80 MG/ML IJ SUSP
80.0000 mg | Freq: Once | INTRAMUSCULAR | Status: AC
  Administered 2022-03-11: 80 mg

## 2022-03-13 ENCOUNTER — Other Ambulatory Visit: Payer: Self-pay | Admitting: Family Medicine

## 2022-03-14 ENCOUNTER — Other Ambulatory Visit (HOSPITAL_COMMUNITY): Payer: Self-pay

## 2022-03-14 MED ORDER — ALLOPURINOL 100 MG PO TABS
200.0000 mg | ORAL_TABLET | Freq: Every day | ORAL | 3 refills | Status: DC
  Filled 2022-03-14: qty 180, 90d supply, fill #0
  Filled 2022-06-11: qty 180, 90d supply, fill #1
  Filled 2022-09-13: qty 180, 90d supply, fill #2
  Filled 2022-12-10: qty 180, 90d supply, fill #3

## 2022-03-24 NOTE — Progress Notes (Signed)
Functional Pain Scale - descriptive words and definitions  Distracting (5)    Aware of pain/able to complete some ADL's but limited by pain/sleep is affected and active distractions are only slightly useful. Moderate range order  Average Pain 5   +Driver, -BT, -Dye Allergies.  

## 2022-03-24 NOTE — Procedures (Signed)
Cervical Epidural Steroid Injection - Interlaminar Approach with Fluoroscopic Guidance  Patient: Erik Rubio.      Date of Birth: [birth year] MRN: [id number] PCP: [place], Betty G, MD      Visit Date: 03/11/2022   Universal Protocol:    Date/Time: 01/29/245:22 AM  Consent Given By: the patient  Position: PRONE  Additional Comments: Vital signs were monitored before and after the procedure. Patient was prepped and draped in the usual sterile fashion. The correct patient, procedure, and site was verified.   Injection Procedure Details:   Procedure diagnoses: Cervical radiculopathy [M54.12]    Meds Administered:  Meds ordered this encounter  Medications   methylPREDNISolone acetate (DEPO-MEDROL) injection 80 mg   tiZANidine (ZANAFLEX) 4 MG tablet    Sig: Take 1 tablet (4 mg total) by mouth every 6 (six) hours as needed for muscle spasms.    Dispense:  30 tablet    Refill:  0     Laterality: Right  Location/Site: C7-T1  Needle: 3.5 in., 20 ga. Tuohy  Needle Placement: Paramedian epidural space  Findings:  -Comments: Excellent flow of contrast into the epidural space.  Procedure Details: Using a paramedian approach from the side mentioned above, the region overlying the inferior lamina was localized under fluoroscopic visualization and the soft tissues overlying this structure were infiltrated with 4 ml. of 1% Lidocaine without Epinephrine. A # 20 gauge, Tuohy needle was inserted into the epidural space using a paramedian approach.  The epidural space was localized using loss of resistance along with contralateral oblique bi-planar fluoroscopic views.  After negative aspirate for air, blood, and CSF, a 2 ml. volume of Isovue-250 was injected into the epidural space and the flow of contrast was observed. Radiographs were obtained for documentation purposes.   The injectate was administered into the level noted above.  Additional Comments:  The patient  tolerated the procedure well Dressing: 2 x 2 sterile gauze and Band-Aid    Post-procedure details: Patient was observed during the procedure. Post-procedure instructions were reviewed.  Patient left the clinic in stable condition.

## 2022-03-24 NOTE — Progress Notes (Signed)
Erik Rubio. - 57 y.o. male MRN [id number]  Date of birth: [birth year]  Office Visit Note: Visit Date: 03/11/2022 PCP: Erik, Betty G, MD Referred by: Erik, Betty G, MD  Subjective: Chief Complaint  Patient presents with   Middle Back - Pain   Right Shoulder - Pain   Right Arm - Pain   HPI:  Erik Rubio. is a 57 y.o. male who comes in today at the request of Erik Goodie, FNP for planned Right C7-T1 Cervical Interlaminar epidural steroid injection with fluoroscopic guidance.  The patient has failed conservative care including home exercise, medications, time and activity modification.  This injection will be diagnostic and hopefully therapeutic.  Please see requesting physician notes for further details and justification.   ROS Otherwise per HPI.  Assessment & Plan: Visit Diagnoses:    ICD-10-CM   1. Cervical radiculopathy  M54.12 XR C-ARM NO REPORT    Epidural Steroid injection    methylPREDNISolone acetate (DEPO-MEDROL) injection 80 mg      Plan: No additional findings.   Meds & Orders:  Meds ordered this encounter  Medications   methylPREDNISolone acetate (DEPO-MEDROL) injection 80 mg   tiZANidine (ZANAFLEX) 4 MG tablet    Sig: Take 1 tablet (4 mg total) by mouth every 6 (six) hours as needed for muscle spasms.    Dispense:  30 tablet    Refill:  0    Orders Placed This Encounter  Procedures   XR C-ARM NO REPORT   Epidural Steroid injection    Follow-up: Return for visit to requesting provider as needed.   Procedures: No procedures performed  Cervical Epidural Steroid Injection - Interlaminar Approach with Fluoroscopic Guidance  Patient: Erik Rubio.      Date of Birth: Jul 01, 1965 MRN: [id number] PCP: Erik, Betty G, MD      Visit Date: 03/11/2022   Universal Protocol:    Date/Time: 01/29/245:22 AM  Consent Given By: the patient  Position: PRONE  Additional Comments: Vital signs were monitored  before and after the procedure. Patient was prepped and draped in the usual sterile fashion. The correct patient, procedure, and site was verified.   Injection Procedure Details:   Procedure diagnoses: Cervical radiculopathy [M54.12]    Meds Administered:  Meds ordered this encounter  Medications   methylPREDNISolone acetate (DEPO-MEDROL) injection 80 mg   tiZANidine (ZANAFLEX) 4 MG tablet    Sig: Take 1 tablet (4 mg total) by mouth every 6 (six) hours as needed for muscle spasms.    Dispense:  30 tablet    Refill:  0     Laterality: Right  Location/Site: C7-T1  Needle: 3.5 in., 20 ga. Tuohy  Needle Placement: Paramedian epidural space  Findings:  -Comments: Excellent flow of contrast into the epidural space.  Procedure Details: Using a paramedian approach from the side mentioned above, the region overlying the inferior lamina was localized under fluoroscopic visualization and the soft tissues overlying this structure were infiltrated with 4 ml. of 1% Lidocaine without Epinephrine. A # 20 gauge, Tuohy needle was inserted into the epidural space using a paramedian approach.  The epidural space was localized using loss of resistance along with contralateral oblique bi-planar fluoroscopic views.  After negative aspirate for air, blood, and CSF, a 2 ml. volume of Isovue-250 was injected into the epidural space and the flow of contrast was observed. Radiographs were obtained for documentation purposes.   The injectate was administered into the level noted above.  Additional Comments:  The patient tolerated the procedure well Dressing: 2 x 2 sterile gauze and Band-Aid    Post-procedure details: Patient was observed during the procedure. Post-procedure instructions were reviewed.  Patient left the clinic in stable condition.   Clinical History: CLINICAL DATA:  Initial evaluation for chronic neck pain and stiffness with right upper extremity numbness.   EXAM: MRI CERVICAL  SPINE WITHOUT CONTRAST   TECHNIQUE: Multiplanar, multisequence MR imaging of the cervical spine was performed. No intravenous contrast was administered.   COMPARISON:  Comparison made with previous MRI from 12/23/2016.   FINDINGS: Alignment: Straightening of the normal cervical lordosis. Trace degenerative retrolisthesis of C2 on C3.   Vertebrae: Vertebral body height maintained without acute or interval fracture. Abnormal appearance of the C6 vertebral body with T1 hypointense, stir hyperintense signal intensity again seen. Overall appearance is relatively stable as compared to prior MRI from 2018, suggesting a benign lesion. Underlying bone marrow signal intensity diffusely decreased on T1 weighted imaging, nonspecific, but most commonly related to anemia, smoking, or obesity. Mild scattered reactive endplate changes noted at C5-6 through C7-T1. No other abnormal marrow edema.   Cord: Normal signal and morphology.   Posterior Fossa, vertebral arteries, paraspinal tissues: Unremarkable.   Disc levels:   C2-C3: Left paracentral disc protrusion indents the left ventral thecal sac (series 6, image 5). Secondary flattening of the left hemi cord without cord signal changes. Superimposed mild facet hypertrophy. Resultant mild-to-moderate spinal stenosis. Superimposed left-sided uncovertebral spurring with resultant moderate left C3 foraminal narrowing. Right neural foramina remains patent.   C3-C4: Lobulated broad-based central to left paracentral disc protrusion flattens and indents the ventral thecal sac (series 5, image 19). Mild cord flattening without cord signal changes. Moderate spinal stenosis. Superimposed bilateral uncovertebral spurring, with right worse than left facet hypertrophy. Resultant moderate to severe bilateral C4 foraminal stenosis.   C4-C5: Broad based central to left paracentral disc protrusion indents the ventral thecal sac (series 6, image 13).  Minimal cord flattening without cord signal changes. Mild to moderate spinal stenosis. Foramina remain patent.   C5-C6: Mild degenerative intervertebral disc space narrowing. Left paracentral disc protrusion indents the left ventral thecal sac, contacting and mildly flattening the ventral cord (series 6, image 17). Additionally, there is a right paracentral to foraminal disc extrusion with slight superior migration (series 2, image 18). Resultant moderate spinal stenosis at this level. Superimposed uncovertebral spurring with resultant mild right worse than left C6 foraminal stenosis. This is suspected to be the symptomatic level.   C6-C7: Broad-based right paracentral disc protrusion indents the right ventral thecal sac (series 5, image 21). Secondary cord flattening without cord signal changes. Mild spinal stenosis. Foramina remain patent.   C7-T1: Mild disc bulge with bilateral uncovertebral spurring. Mild left-sided facet hypertrophy. No significant spinal stenosis. Foramina remain patent.   Visualized upper thoracic spine demonstrates mild noncompressive disc bulging at T1-2 without significant stenosis.   IMPRESSION: 1. Right paracentral to foraminal disc extrusion with superior migration at C5-6, suspected to be the symptomatic level. 2. Right paracentral disc protrusion at C6-7 with secondary cord flattening and mild spinal stenosis, but no cord signal changes. 3. Broad-based central to left paracentral disc protrusion at C3-4 with resultant moderate spinal stenosis, with moderate to severe bilateral C4 foraminal narrowing. 4. Left paracentral disc protrusion at C2-3 with resultant mild to moderate spinal stenosis, with moderate left C3 foraminal narrowing.     Electronically Signed   By: Jeannine Boga M.D.   On: 03/03/2022 19:09  Objective:  VS:  HT:    WT:   BMI:     BP:(!) 168/96  HR:(!) 101bpm  TEMP: ( )  RESP:  Physical Exam Vitals and nursing  note reviewed.  Constitutional:      General: He is not in acute distress.    Appearance: Normal appearance. He is not ill-appearing.  HENT:     Head: Normocephalic and atraumatic.     Right Ear: External ear normal.     Left Ear: External ear normal.  Eyes:     Extraocular Movements: Extraocular movements intact.  Cardiovascular:     Rate and Rhythm: Normal rate.     Pulses: Normal pulses.  Abdominal:     General: There is no distension.     Palpations: Abdomen is soft.  Musculoskeletal:        General: No signs of injury.     Cervical back: Neck supple. Tenderness present. No rigidity.     Right lower leg: No edema.     Left lower leg: No edema.     Comments: Patient has good strength in the upper extremities with 5 out of 5 strength in wrist extension long finger flexion APB.  No intrinsic hand muscle atrophy.  Negative Hoffmann's test.  Lymphadenopathy:     Cervical: No cervical adenopathy.  Skin:    Findings: No erythema or rash.  Neurological:     General: No focal deficit present.     Mental Status: He is alert and oriented to person, place, and time.     Sensory: No sensory deficit.     Motor: No weakness or abnormal muscle tone.     Coordination: Coordination normal.  Psychiatric:        Mood and Affect: Mood normal.        Behavior: Behavior normal.      Imaging: No results found.

## 2022-03-27 DIAGNOSIS — N401 Enlarged prostate with lower urinary tract symptoms: Secondary | ICD-10-CM | POA: Diagnosis not present

## 2022-03-27 DIAGNOSIS — R3914 Feeling of incomplete bladder emptying: Secondary | ICD-10-CM | POA: Diagnosis not present

## 2022-04-02 ENCOUNTER — Other Ambulatory Visit: Payer: Self-pay | Admitting: Family Medicine

## 2022-04-02 DIAGNOSIS — N529 Male erectile dysfunction, unspecified: Secondary | ICD-10-CM

## 2022-04-02 MED ORDER — SILDENAFIL CITRATE 20 MG PO TABS
40.0000 mg | ORAL_TABLET | Freq: Every day | ORAL | 1 refills | Status: DC | PRN
  Filled 2022-04-02: qty 60, 30d supply, fill #0
  Filled 2022-08-05: qty 60, 30d supply, fill #1

## 2022-04-03 ENCOUNTER — Other Ambulatory Visit (HOSPITAL_COMMUNITY): Payer: Self-pay

## 2022-04-03 DIAGNOSIS — N401 Enlarged prostate with lower urinary tract symptoms: Secondary | ICD-10-CM | POA: Diagnosis not present

## 2022-04-03 DIAGNOSIS — R3914 Feeling of incomplete bladder emptying: Secondary | ICD-10-CM | POA: Diagnosis not present

## 2022-04-03 DIAGNOSIS — R972 Elevated prostate specific antigen [PSA]: Secondary | ICD-10-CM | POA: Diagnosis not present

## 2022-04-03 MED ORDER — SILODOSIN 8 MG PO CAPS
8.0000 mg | ORAL_CAPSULE | Freq: Every day | ORAL | 3 refills | Status: AC
  Filled 2022-04-03: qty 90, 90d supply, fill #0

## 2022-06-11 ENCOUNTER — Other Ambulatory Visit: Payer: Self-pay | Admitting: Family Medicine

## 2022-06-11 DIAGNOSIS — M109 Gout, unspecified: Secondary | ICD-10-CM

## 2022-06-12 ENCOUNTER — Other Ambulatory Visit (HOSPITAL_COMMUNITY): Payer: Self-pay

## 2022-06-13 ENCOUNTER — Other Ambulatory Visit (HOSPITAL_COMMUNITY): Payer: Self-pay

## 2022-06-13 MED ORDER — COLCHICINE 0.6 MG PO TABS
0.6000 mg | ORAL_TABLET | Freq: Two times a day (BID) | ORAL | 1 refills | Status: DC | PRN
  Filled 2022-06-13: qty 30, 15d supply, fill #0
  Filled 2022-12-28: qty 30, 15d supply, fill #1

## 2022-06-20 ENCOUNTER — Other Ambulatory Visit (HOSPITAL_COMMUNITY): Payer: Self-pay

## 2022-06-20 MED ORDER — HYDROCODONE-ACETAMINOPHEN 5-325 MG PO TABS
1.0000 | ORAL_TABLET | ORAL | 0 refills | Status: DC | PRN
  Filled 2022-06-20: qty 12, 2d supply, fill #0

## 2022-06-20 MED ORDER — AMOXICILLIN 500 MG PO CAPS
500.0000 mg | ORAL_CAPSULE | Freq: Three times a day (TID) | ORAL | 0 refills | Status: DC
  Filled 2022-06-20: qty 21, 7d supply, fill #0

## 2022-08-06 ENCOUNTER — Other Ambulatory Visit (HOSPITAL_COMMUNITY): Payer: Self-pay

## 2022-09-15 ENCOUNTER — Other Ambulatory Visit (HOSPITAL_COMMUNITY): Payer: Self-pay

## 2022-10-28 ENCOUNTER — Other Ambulatory Visit (HOSPITAL_COMMUNITY): Payer: Self-pay

## 2022-12-29 ENCOUNTER — Other Ambulatory Visit: Payer: Self-pay

## 2022-12-31 ENCOUNTER — Other Ambulatory Visit (HOSPITAL_COMMUNITY): Payer: Self-pay

## 2022-12-31 ENCOUNTER — Other Ambulatory Visit: Payer: Self-pay

## 2023-01-07 ENCOUNTER — Other Ambulatory Visit: Payer: Self-pay | Admitting: Family Medicine

## 2023-01-07 DIAGNOSIS — N529 Male erectile dysfunction, unspecified: Secondary | ICD-10-CM

## 2023-01-09 ENCOUNTER — Other Ambulatory Visit (HOSPITAL_COMMUNITY): Payer: Self-pay

## 2023-01-09 MED ORDER — SILDENAFIL CITRATE 20 MG PO TABS
40.0000 mg | ORAL_TABLET | Freq: Every day | ORAL | 1 refills | Status: DC | PRN
  Filled 2023-01-09: qty 60, 30d supply, fill #0

## 2023-02-04 ENCOUNTER — Encounter: Payer: Commercial Managed Care - PPO | Admitting: Family Medicine

## 2023-03-09 ENCOUNTER — Other Ambulatory Visit: Payer: Self-pay | Admitting: Family Medicine

## 2023-03-10 ENCOUNTER — Other Ambulatory Visit (HOSPITAL_COMMUNITY): Payer: Self-pay

## 2023-03-10 MED ORDER — ALLOPURINOL 100 MG PO TABS
200.0000 mg | ORAL_TABLET | Freq: Every day | ORAL | 0 refills | Status: DC
  Filled 2023-03-10: qty 180, 90d supply, fill #0

## 2023-04-08 DIAGNOSIS — N401 Enlarged prostate with lower urinary tract symptoms: Secondary | ICD-10-CM | POA: Diagnosis not present

## 2023-04-08 DIAGNOSIS — R3914 Feeling of incomplete bladder emptying: Secondary | ICD-10-CM | POA: Diagnosis not present

## 2023-04-14 ENCOUNTER — Other Ambulatory Visit (HOSPITAL_COMMUNITY): Payer: Self-pay

## 2023-04-14 ENCOUNTER — Other Ambulatory Visit: Payer: Self-pay | Admitting: Family Medicine

## 2023-04-14 DIAGNOSIS — I10 Essential (primary) hypertension: Secondary | ICD-10-CM

## 2023-04-14 MED ORDER — OLMESARTAN MEDOXOMIL-HCTZ 40-12.5 MG PO TABS
1.0000 | ORAL_TABLET | Freq: Every day | ORAL | 2 refills | Status: AC
  Filled 2023-04-14: qty 90, 90d supply, fill #0
  Filled 2023-09-13: qty 90, 90d supply, fill #1
  Filled 2024-03-30: qty 90, 90d supply, fill #2

## 2023-04-29 ENCOUNTER — Encounter: Payer: Commercial Managed Care - PPO | Admitting: Family Medicine

## 2023-05-01 DIAGNOSIS — R3912 Poor urinary stream: Secondary | ICD-10-CM | POA: Diagnosis not present

## 2023-05-01 DIAGNOSIS — N401 Enlarged prostate with lower urinary tract symptoms: Secondary | ICD-10-CM | POA: Diagnosis not present

## 2023-05-01 DIAGNOSIS — R972 Elevated prostate specific antigen [PSA]: Secondary | ICD-10-CM | POA: Diagnosis not present

## 2023-05-11 NOTE — Progress Notes (Signed)
 HPI: [name]. is a 58 y.o.male with a PMHx significant for gout, BPH, HTN, ED, and HLD, who is here today for his routine physical examination.  Last CPE: 01/21/2022. He has since seen orthopedics for his neck and urology for his annual visit.   Exercise: Patient states he takes a long walk twice per week with his wife.  Diet: He cooks at home and eats vegetables daily. He notes he has gained some weight recently.  Sleep: 7-8 hours per night on average.  Alcohol Use: none Smoking: never Vision: He missed his appointment last year but has otherwise been annually.  Dental: UTD on routine dental care.   Immunization History  Administered Date(s) Administered   Influenza, Seasonal, Injecte, Preservative Fre 01/08/2023   Influenza,inj,Quad PF,6+ Mos 11/23/2017, 11/30/2018, 12/13/2019, 12/18/2020, 01/21/2022   Influenza-Unspecified 11/18/2016   Moderna SARS-COV2 Booster Vaccination 06/20/2019, 01/24/2020, 06/04/2020   Moderna Sars-Covid-2 Vaccination 05/23/2019   PNEUMOCOCCAL CONJUGATE-20 05/12/2023   Pfizer Covid-19 Vaccine Bivalent Booster 54yrs & up 02/14/2021   Tdap 12/13/2019   Zoster Recombinant(Shingrix) 01/21/2022, 05/12/2023   Health Maintenance  Topic Date Due   Colonoscopy  Never done   COVID-19 Vaccine (6 - 2024-25 season) 05/28/2023 (Originally 10/26/2022)   DTaP/Tdap/Td (2 - Td or Tdap) 12/12/2029   INFLUENZA VACCINE  Completed   Hepatitis C Screening  Completed   HIV Screening  Completed   Zoster Vaccines- Shingrix  Completed   Pneumococcal Vaccine 41-52 Years old  Aged Out   HPV VACCINES  Aged Out   Chronic medical problems:   Hypertension:  Medications: Currently on olmesartan-hydrochlorothiazide 40-12.5 mg daily.  BP readings at home: He checks his BP at home and says his readings are 120s/85-90.  Side effects: none Negative for unusual or severe headache, exertional chest pain, dyspnea,or edema. Lab Results  Component Value Date    NA 138 01/21/2022   CL 101 01/21/2022   K 4.2 01/21/2022   CO2 27 01/21/2022   BUN 16 01/21/2022   CREATININE 1.19 01/21/2022   GFR 69.44 01/21/2022   CALCIUM 10.0 01/21/2022   ALBUMIN 4.9 01/21/2022   GLUCOSE 99 01/21/2022   Hyperlipidemia: Not currently on pharmacologic treatment.  Lab Results  Component Value Date   CHOL 229 (H) 01/21/2022   HDL 51.50 01/21/2022   LDLCALC 139 (H) 06/18/2021   LDLDIRECT 129.0 01/21/2022   TRIG 214.0 (H) 01/21/2022   CHOLHDL 4 01/21/2022   Gout:  He believes he has had two gout attacks over the last year.  He takes allopurinol 200 mg daily and colchicine 0.6 mg twice daily as needed.  Also hx of OA.  ED:  Currently on sildenafil 40 mg daily as needed, feels like it is not enough, would like to increase dose. No side effects reported.  BPH:  Currently on Sildosin 40 mg daily. Follows with urologist annually. Last PSA in 03/2023 was 2.5.  Concerns today:   He would like to have microalb/Cr repeated. It was done before because abnormal renal function test. He received letter about error with lab results. Lab Results  Component Value Date   MICROALBUR <0.7 12/18/2020   Review of Systems  Constitutional:  Negative for activity change, appetite change and fever.  HENT:  Negative for nosebleeds, sore throat and trouble swallowing.   Eyes:  Negative for redness and visual disturbance.  Respiratory:  Negative for apnea, cough, shortness of breath and wheezing.   Cardiovascular:  Negative for chest pain, palpitations and leg swelling.  Gastrointestinal:  Negative for abdominal pain, blood in stool, nausea and vomiting.  Endocrine: Negative for cold intolerance, heat intolerance, polydipsia, polyphagia and polyuria.  Genitourinary:  Negative for decreased urine volume, dysuria, genital sores, hematuria and testicular pain.  Musculoskeletal:  Positive for arthralgias. Negative for gait problem.  Skin:  Negative for color change and rash.   Allergic/Immunologic: Positive for environmental allergies.  Neurological:  Negative for syncope, facial asymmetry and weakness.  Hematological:  Negative for adenopathy. Does not bruise/bleed easily.  Psychiatric/Behavioral:  Negative for confusion and sleep disturbance.   All other systems reviewed and are negative.  Current Outpatient Medications on File Prior to Visit  Medication Sig Dispense Refill   acetaminophen (TYLENOL) 500 MG tablet Take 500 mg by mouth every 6 (six) hours as needed.     albuterol (VENTOLIN HFA) 108 (90 Base) MCG/ACT inhaler Inhale 2 puffs into the lungs every 6 (six) hours as needed for wheezing or shortness of breath. 6.7 g 2   allopurinol (ZYLOPRIM) 100 MG tablet Take 2 tablets (200 mg total) by mouth daily. 180 tablet 0   gabapentin (NEURONTIN) 300 MG capsule Take 1 capsule (300 mg total) by mouth 3 (three) times daily. 90 capsule 0   mometasone (NASONEX 24HR) 50 MCG/ACT nasal spray Place 2 sprays into the nose daily. 17 g 12   olmesartan-hydrochlorothiazide (BENICAR HCT) 40-12.5 MG tablet Take 1 tablet by mouth daily. 90 tablet 2   silodosin (RAPAFLO) 8 MG CAPS capsule Take 1 capsule (8 mg total) by mouth daily. 90 capsule 3   silodosin (RAPAFLO) 8 MG CAPS capsule Take 1 capsule (8 mg total) by mouth daily. 90 capsule 3   tiZANidine (ZANAFLEX) 4 MG tablet Take 1 tablet (4 mg total) by mouth every 6 (six) hours as needed for muscle spasms. 30 tablet 0   No current facility-administered medications on file prior to visit.    Past Medical History:  Diagnosis Date   Allergy 2023   Cipro (Hives), and a little of Bactrim   Arthritis    Knee OA   Hypertension     Past Surgical History:  Procedure Laterality Date   CHOLECYSTECTOMY  2003    Allergies  Allergen Reactions   Ciprofloxacin Hives    Family History  Problem Relation Age of Onset   Stroke Mother        aneurysm   Hypertension Mother    Asthma Mother    Hypertension Father     Social  History   Socioeconomic History   Marital status: Married    Spouse name: Not on file   Number of children: Not on file   Years of education: Not on file   Highest education level: Bachelor's degree (e.g., BA, AB, BS)  Occupational History   Not on file  Tobacco Use   Smoking status: Never   Smokeless tobacco: Never  Substance and Sexual Activity   Alcohol use: Not Currently    Comment: Occasional   Drug use: No   Sexual activity: Never  Other Topics Concern   Not on file  Social History Narrative   Not on file   Social Drivers of Health   Financial Resource Strain: Low Risk  (05/11/2023)   Overall Financial Resource Strain (CARDIA)    Difficulty of Paying Living Expenses: Not hard at all  Food Insecurity: No Food Insecurity (05/11/2023)   Hunger Vital Sign    Worried About Running Out of Food in the Last Year: Never true  Ran Out of Food in the Last Year: Never true  Transportation Needs: No Transportation Needs (05/11/2023)   PRAPARE - Administrator, Civil Service (Medical): No    Lack of Transportation (Non-Medical): No  Physical Activity: Insufficiently Active (05/11/2023)   Exercise Vital Sign    Days of Exercise per Week: 2 days    Minutes of Exercise per Session: 30 min  Stress: No Stress Concern Present (05/11/2023)   [employer] of Occupational Health - Occupational Stress Questionnaire    Feeling of Stress : Not at all  Social Connections: Moderately Integrated (05/11/2023)   Social Connection and Isolation Panel [NHANES]    Frequency of Communication with Friends and Family: More than three times a week    Frequency of Social Gatherings with Friends and Family: Once a week    Attends Religious Services: 1 to 4 times per year    Active Member of [employer] or Organizations: No    Attends Banker Meetings: Not on file    Marital Status: Married   Vitals:   05/12/23 0707 05/12/23 0744  BP: (!) 140/90 (!) 130/90  Pulse: 97   Resp:  12   Temp: 99 F (37.2 C)   SpO2: 99%    Body mass index is 29.32 kg/m.  Wt Readings from Last 3 Encounters:  05/12/23 204 lb 6 oz (92.7 kg)  01/21/22 197 lb (89.4 kg)  12/11/21 199 lb 4 oz (90.4 kg)   Physical Exam Vitals and nursing note reviewed.  Constitutional:      General: He is not in acute distress.    Appearance: He is well-developed.  HENT:     Head: Normocephalic and atraumatic.     Right Ear: Tympanic membrane, ear canal and external ear normal.     Left Ear: Tympanic membrane, ear canal and external ear normal.     Mouth/Throat:     Mouth: Mucous membranes are moist.     Pharynx: Oropharynx is clear. Uvula midline.  Eyes:     Extraocular Movements: Extraocular movements intact.     Conjunctiva/sclera: Conjunctivae normal.     Pupils: Pupils are equal, round, and reactive to light.  Neck:     Thyroid: No thyroid mass or thyromegaly.     Trachea: No tracheal deviation.  Cardiovascular:     Rate and Rhythm: Normal rate and regular rhythm.     Pulses:          Dorsalis pedis pulses are 2+ on the right side and 2+ on the left side.     Heart sounds: No murmur heard. Pulmonary:     Effort: Pulmonary effort is normal. No respiratory distress.     Breath sounds: Normal breath sounds.  Abdominal:     Palpations: Abdomen is soft. There is no hepatomegaly or mass.     Tenderness: There is no abdominal tenderness.  Genitourinary:    Comments: No concerns. Musculoskeletal:        General: No tenderness.     Cervical back: Normal range of motion.     Right lower leg: No edema.     Left lower leg: No edema.     Comments: No major deformities appreciated and no signs of synovitis.  Lymphadenopathy:     Cervical: No cervical adenopathy.     Upper Body:     Right upper body: No supraclavicular adenopathy.     Left upper body: No supraclavicular adenopathy.  Skin:    General: Skin is  warm.     Findings: No erythema.  Neurological:     General: No focal deficit  present.     Mental Status: He is alert and oriented to person, place, and time.     Cranial Nerves: No cranial nerve deficit.     Sensory: No sensory deficit.     Motor: No weakness.     Gait: Gait normal.     Deep Tendon Reflexes:     Reflex Scores:      Bicep reflexes are 2+ on the right side and 2+ on the left side.      Patellar reflexes are 2+ on the right side and 2+ on the left side. Psychiatric:        Mood and Affect: Mood and affect normal.   ASSESSMENT AND PLAN:  Mr. Matilde Haymaker was seen today for his routine general medical examination.   Orders Placed This Encounter  Procedures   Zoster Recombinant (Shingrix )   Pneumococcal conjugate vaccine 20-valent (Prevnar 20)   Comprehensive metabolic panel   Hemoglobin A1c   Lipid panel   Microalbumin / creatinine urine ratio   Uric acid   Lab Results  Component Value Date   LABURIC 6.8 05/12/2023   Lab Results  Component Value Date   HGBA1C 5.8 05/12/2023   Lab Results  Component Value Date   NA 136 05/12/2023   CL 98 05/12/2023   K 4.5 05/12/2023   CO2 28 05/12/2023   BUN 13 05/12/2023   CREATININE 1.16 05/12/2023   GFR 70.94 05/12/2023   CALCIUM 10.3 05/12/2023   ALBUMIN 5.1 05/12/2023   GLUCOSE 99 05/12/2023   Lab Results  Component Value Date   ALT 40 05/12/2023   AST 36 05/12/2023   ALKPHOS 78 05/12/2023   BILITOT 0.6 05/12/2023   Lab Results  Component Value Date   CHOL 255 (H) 05/12/2023   HDL 59.30 05/12/2023   LDLCALC 141 (H) 05/12/2023   LDLDIRECT 129.0 01/21/2022   TRIG 275.0 (H) 05/12/2023   CHOLHDL 4 05/12/2023   The 10-year ASCVD risk score (Arnett DK, et al., 2019) is: 7.6%   Values used to calculate the score:     Age: 66 years     Sex: Male     Is Non-Hispanic African American: No     Diabetic: No     Tobacco smoker: No     Systolic Blood Pressure: 130 mmHg     Is BP treated: Yes     HDL Cholesterol: 59.3 mg/dL     Total Cholesterol: 255 mg/dL  Lab Results  Component Value  Date   MICROALBUR <0.7 05/12/2023   MICROALBUR <0.7 12/18/2020   Routine general medical examination at a health care facility Assessment & Plan: We discussed the importance of regular physical activity and healthy diet for prevention of chronic illness and/or complications. Colon cancer screening with cologuard, due in 11/2023. Preventive guidelines reviewed. Vaccination updated, Prevnar 20 and Shingrix given today. Next CPE in a year.   Hyperlipidemia, unspecified hyperlipidemia type Assessment & Plan: Continue nonpharmacologic treatment. Further recommendations will be given according to 10 years CVD risk score and lipid panel numbers.  Orders: -     Comprehensive metabolic panel; Future -     Lipid panel; Future  Hypertension, essential, benign Assessment & Plan: BP mildly elevated today, re-checked 130/90.  Reporting better BPs at home. He prefers to continue same medications for now, concerned about possible orthostatic hypotension. Continue olmesartan-HCTZ 40-12.5 mg daily and low-salt  diet. Continue monitoring BP regularly. He is overdue for eye exam. As far as problem is stable, annual follow-up can be continued.   Gouty arthritis of both ankles Assessment & Plan: For now continue allopurinol 100 mg daily and colchicine 0.6 mg daily as needed. Further recommendation will be given according to uric acid result.  Orders: -     Uric acid; Future -     Colchicine; Take 1 tablet (0.6 mg total) by mouth 2 (two) times daily as needed.  Dispense: 30 tablet; Refill: 1  Screening for endocrine, metabolic and immunity disorder -     Comprehensive metabolic panel; Future -     Hemoglobin A1c; Future  Abnormal renal function test -     Microalbumin / creatinine urine ratio; Future  Erectile dysfunction, unspecified erectile dysfunction type Assessment & Plan: Sildenafil 60 mg is not helping, he can take up to 100 mg daily as needed. New prescription sent.  Orders: -      Sildenafil Citrate; Take 5 tablets (100 mg total) by mouth daily as needed.  Dispense: 120 tablet; Refill: 4  Need for pneumococcal vaccination -     Pneumococcal conjugate vaccine 20-valent  Need for shingles vaccine -     Varicella-zoster vaccine IM  Return in 1 year (on 05/11/2024) for CPE, chronic problems, Labs.  I, Rolla Etienne Wierda, acting as a scribe for Aking Klabunde [place], MD., have documented all relevant documentation on the behalf of Jadelin Eng [place], MD, as directed by  Jacaria Colburn [place], MD while in the presence of Jesilyn Easom [place], MD.   I, Koen Antilla [place], MD, have reviewed all documentation for this visit. The documentation on 05/12/23 for the exam, diagnosis, procedures, and orders are all accurate and complete.  Canton Yearby G. [place], MD  [facility] [place] office.

## 2023-05-12 ENCOUNTER — Encounter: Payer: Self-pay | Admitting: Family Medicine

## 2023-05-12 ENCOUNTER — Ambulatory Visit (INDEPENDENT_AMBULATORY_CARE_PROVIDER_SITE_OTHER): Admitting: Family Medicine

## 2023-05-12 ENCOUNTER — Other Ambulatory Visit (HOSPITAL_COMMUNITY): Payer: Self-pay

## 2023-05-12 VITALS — BP 130/90 | HR 97 | Temp 99.0°F | Resp 12 | Ht 70.0 in | Wt 204.4 lb

## 2023-05-12 DIAGNOSIS — N529 Male erectile dysfunction, unspecified: Secondary | ICD-10-CM

## 2023-05-12 DIAGNOSIS — M109 Gout, unspecified: Secondary | ICD-10-CM

## 2023-05-12 DIAGNOSIS — E785 Hyperlipidemia, unspecified: Secondary | ICD-10-CM

## 2023-05-12 DIAGNOSIS — Z0000 Encounter for general adult medical examination without abnormal findings: Secondary | ICD-10-CM | POA: Diagnosis not present

## 2023-05-12 DIAGNOSIS — I10 Essential (primary) hypertension: Secondary | ICD-10-CM | POA: Diagnosis not present

## 2023-05-12 DIAGNOSIS — R944 Abnormal results of kidney function studies: Secondary | ICD-10-CM

## 2023-05-12 DIAGNOSIS — Z23 Encounter for immunization: Secondary | ICD-10-CM

## 2023-05-12 DIAGNOSIS — Z1329 Encounter for screening for other suspected endocrine disorder: Secondary | ICD-10-CM | POA: Diagnosis not present

## 2023-05-12 DIAGNOSIS — Z130 Encounter for screening for diseases of the blood and blood-forming organs and certain disorders involving the immune mechanism: Secondary | ICD-10-CM | POA: Diagnosis not present

## 2023-05-12 DIAGNOSIS — R778 Other specified abnormalities of plasma proteins: Secondary | ICD-10-CM | POA: Diagnosis not present

## 2023-05-12 DIAGNOSIS — Z13228 Encounter for screening for other metabolic disorders: Secondary | ICD-10-CM | POA: Diagnosis not present

## 2023-05-12 LAB — LIPID PANEL
Cholesterol: 255 mg/dL — ABNORMAL HIGH (ref 0–200)
HDL: 59.3 mg/dL (ref >39.00)
LDL Cholesterol: 141 mg/dL — ABNORMAL HIGH (ref 0–99)
NonHDL: 195.59
Total CHOL/HDL Ratio: 4
Triglycerides: 275 mg/dL — ABNORMAL HIGH (ref 0.0–149.0)
VLDL: 55 mg/dL — ABNORMAL HIGH (ref 0.0–40.0)

## 2023-05-12 LAB — COMPREHENSIVE METABOLIC PANEL
ALT: 40 U/L (ref 0–53)
AST: 36 U/L (ref 0–37)
Albumin: 5.1 g/dL (ref 3.5–5.2)
Alkaline Phosphatase: 78 U/L (ref 39–117)
BUN: 13 mg/dL (ref 6–23)
CO2: 28 meq/L (ref 19–32)
Calcium: 10.3 mg/dL (ref 8.4–10.5)
Chloride: 98 meq/L (ref 96–112)
Creatinine, Ser: 1.16 mg/dL (ref 0.40–1.50)
GFR: 70.94 mL/min (ref >60.00)
Glucose, Bld: 99 mg/dL (ref 70–99)
Potassium: 4.5 meq/L (ref 3.5–5.1)
Sodium: 136 meq/L (ref 135–145)
Total Bilirubin: 0.6 mg/dL (ref 0.2–1.2)
Total Protein: 8.4 g/dL — ABNORMAL HIGH (ref 6.0–8.3)

## 2023-05-12 LAB — MICROALBUMIN / CREATININE URINE RATIO
Creatinine,U: 34.4 mg/dL
Microalb Creat Ratio: UNDETERMINED mg/g (ref 0.0–30.0)
Microalb, Ur: <0.7 mg/dL

## 2023-05-12 LAB — URIC ACID: Uric Acid, Serum: 6.8 mg/dL (ref 4.0–7.8)

## 2023-05-12 LAB — HEMOGLOBIN A1C: Hgb A1c MFr Bld: 5.8 % (ref 4.6–6.5)

## 2023-05-12 MED ORDER — COLCHICINE 0.6 MG PO TABS
0.6000 mg | ORAL_TABLET | Freq: Two times a day (BID) | ORAL | 1 refills | Status: AC | PRN
  Filled 2023-05-12: qty 30, 15d supply, fill #0
  Filled 2024-03-30: qty 30, 15d supply, fill #1

## 2023-05-12 MED ORDER — SILDENAFIL CITRATE 20 MG PO TABS
100.0000 mg | ORAL_TABLET | Freq: Every day | ORAL | 4 refills | Status: AC | PRN
  Filled 2023-05-12: qty 120, 24d supply, fill #0
  Filled 2023-12-11: qty 120, 24d supply, fill #1

## 2023-05-12 NOTE — Assessment & Plan Note (Signed)
 Sildenafil 60 mg is not helping, he can take up to 100 mg daily as needed. New prescription sent.

## 2023-05-12 NOTE — Patient Instructions (Addendum)
 A few things to remember from today's visit:  Routine general medical examination at a health care facility  Hyperlipidemia, unspecified hyperlipidemia type - Plan: Comprehensive metabolic panel, Lipid panel  Hypertension, essential, benign  Gouty arthritis of both ankles - Plan: Uric acid, colchicine 0.6 MG tablet  Screening for endocrine, metabolic and immunity disorder - Plan: Comprehensive metabolic panel, Hemoglobin A1c  Abnormal renal function test - Plan: Microalbumin / creatinine urine ratio  Erectile dysfunction, unspecified erectile dysfunction type - Plan: sildenafil (REVATIO) 20 MG tablet Remind me about cologuard around 09/2023.  If you need refills for medications you take chronically, please call your pharmacy. Do not use My Chart to request refills or for acute issues that need immediate attention. If you send a my chart message, it may take a few days to be addressed, specially if I am not in the office.  Please be sure medication list is accurate. If a new problem present, please set up appointment sooner than planned today.  Health Maintenance, Male Adopting a healthy lifestyle and getting preventive care are important in promoting health and wellness. Ask your health care provider about: The right schedule for you to have regular tests and exams. Things you can do on your own to prevent diseases and keep yourself healthy. What should I know about diet, weight, and exercise? Eat a healthy diet  Eat a diet that includes plenty of vegetables, fruits, low-fat dairy products, and lean protein. Do not eat a lot of foods that are high in solid fats, added sugars, or sodium. Maintain a healthy weight Body mass index (BMI) is a measurement that can be used to identify possible weight problems. It estimates body fat based on height and weight. Your health care provider can help determine your BMI and help you achieve or maintain a healthy weight. Get regular exercise Get  regular exercise. This is one of the most important things you can do for your health. Most adults should: Exercise for at least 150 minutes each week. The exercise should increase your heart rate and make you sweat (moderate-intensity exercise). Do strengthening exercises at least twice a week. This is in addition to the moderate-intensity exercise. Spend less time sitting. Even light physical activity can be beneficial. Watch cholesterol and blood lipids Have your blood tested for lipids and cholesterol at 56 years of age, then have this test every 5 years. You may need to have your cholesterol levels checked more often if: Your lipid or cholesterol levels are high. You are older than 56 years of age. You are at high risk for heart disease. What should I know about cancer screening? Many types of cancers can be detected early and may often be prevented. Depending on your health history and family history, you may need to have cancer screening at various ages. This may include screening for: Colorectal cancer. Prostate cancer. Skin cancer. Lung cancer. What should I know about heart disease, diabetes, and high blood pressure? Blood pressure and heart disease High blood pressure causes heart disease and increases the risk of stroke. This is more likely to develop in people who have high blood pressure readings or are overweight. Talk with your health care provider about your target blood pressure readings. Have your blood pressure checked: Every 3-5 years if you are 81-57 years of age. Every year if you are 53 years old or older. If you are between the ages of 12 and 47 and are a current or former smoker, ask your health care  provider if you should have a one-time screening for abdominal aortic aneurysm (AAA). Diabetes Have regular diabetes screenings. This checks your fasting blood sugar level. Have the screening done: Once every three years after age 70 if you are at a normal weight and  have a low risk for diabetes. More often and at a younger age if you are overweight or have a high risk for diabetes. What should I know about preventing infection? Hepatitis B If you have a higher risk for hepatitis B, you should be screened for this virus. Talk with your health care provider to find out if you are at risk for hepatitis B infection. Hepatitis C Blood testing is recommended for: Everyone born from [birth year] through 1963. Anyone with known risk factors for hepatitis C. Sexually transmitted infections (STIs) You should be screened each year for STIs, including gonorrhea and chlamydia, if: You are sexually active and are younger than 56 years of age. You are older than 56 years of age and your health care provider tells you that you are at risk for this type of infection. Your sexual activity has changed since you were last screened, and you are at increased risk for chlamydia or gonorrhea. Ask your health care provider if you are at risk. Ask your health care provider about whether you are at high risk for HIV. Your health care provider may recommend a prescription medicine to help prevent HIV infection. If you choose to take medicine to prevent HIV, you should first get tested for HIV. You should then be tested every 3 months for as long as you are taking the medicine. Follow these instructions at home: Alcohol use Do not drink alcohol if your health care provider tells you not to drink. If you drink alcohol: Limit how much you have to 0-2 drinks a day. Know how much alcohol is in your drink. In the [place] one drink equals one 12 oz bottle of beer (355 mL), one 5 oz glass of wine (148 mL), or one 1 oz glass of hard liquor (44 mL). Lifestyle Do not use any products that contain nicotine or tobacco. These products include cigarettes, chewing tobacco, and vaping devices, such as e-cigarettes. If you need help quitting, ask your health care provider. Do not use street drugs. Do not  share needles. Ask your health care provider for help if you need support or information about quitting drugs. General instructions Schedule regular health, dental, and eye exams. Stay current with your vaccines. Tell your health care provider if: You often feel depressed. You have ever been abused or do not feel safe at home. Summary Adopting a healthy lifestyle and getting preventive care are important in promoting health and wellness. Follow your health care provider's instructions about healthy diet, exercising, and getting tested or screened for diseases. Follow your health care provider's instructions on monitoring your cholesterol and blood pressure. This information is not intended to replace advice given to you by your health care provider. Make sure you discuss any questions you have with your health care provider. Document Revised: 07/02/2020 Document Reviewed: 07/02/2020 Elsevier Patient Education  2024 [employer].

## 2023-05-12 NOTE — Assessment & Plan Note (Signed)
 Continue nonpharmacologic treatment. Further recommendations will be given according to 10 years CVD risk score and lipid panel numbers.

## 2023-05-12 NOTE — Assessment & Plan Note (Addendum)
 We discussed the importance of regular physical activity and healthy diet for prevention of chronic illness and/or complications. Colon cancer screening with cologuard, due in 11/2023. Preventive guidelines reviewed. Vaccination updated, Prevnar 20 and Shingrix given today. Next CPE in a year.

## 2023-05-12 NOTE — Assessment & Plan Note (Addendum)
 BP mildly elevated today, re-checked 130/90.  Reporting better BPs at home. He prefers to continue same medications for now, concerned about possible orthostatic hypotension. Continue olmesartan-HCTZ 40-12.5 mg daily and low-salt diet. Continue monitoring BP regularly. He is overdue for eye exam. As far as problem is stable, annual follow-up can be continued.

## 2023-05-12 NOTE — Assessment & Plan Note (Signed)
 For now continue allopurinol 100 mg daily and colchicine 0.6 mg daily as needed. Further recommendation will be given according to uric acid result.

## 2023-05-13 ENCOUNTER — Other Ambulatory Visit (HOSPITAL_COMMUNITY): Payer: Self-pay

## 2023-05-13 MED ORDER — ROSUVASTATIN CALCIUM 10 MG PO TABS
10.0000 mg | ORAL_TABLET | Freq: Every day | ORAL | 3 refills | Status: AC
  Filled 2023-05-13: qty 90, 90d supply, fill #0
  Filled 2023-08-06: qty 90, 90d supply, fill #1
  Filled 2023-12-11: qty 90, 90d supply, fill #2
  Filled 2024-03-30: qty 90, 90d supply, fill #3

## 2023-06-17 ENCOUNTER — Other Ambulatory Visit: Payer: Self-pay | Admitting: Family Medicine

## 2023-06-18 ENCOUNTER — Other Ambulatory Visit (HOSPITAL_COMMUNITY): Payer: Self-pay

## 2023-06-18 MED ORDER — ALLOPURINOL 100 MG PO TABS
200.0000 mg | ORAL_TABLET | Freq: Every day | ORAL | 0 refills | Status: DC
  Filled 2023-06-18: qty 180, 90d supply, fill #0

## 2023-06-22 ENCOUNTER — Other Ambulatory Visit (INDEPENDENT_AMBULATORY_CARE_PROVIDER_SITE_OTHER)

## 2023-06-22 DIAGNOSIS — R778 Other specified abnormalities of plasma proteins: Secondary | ICD-10-CM | POA: Diagnosis not present

## 2023-06-22 LAB — CBC
HCT: 49.6 % (ref 39.0–52.0)
Hemoglobin: 16.3 g/dL (ref 13.0–17.0)
MCHC: 32.9 g/dL (ref 30.0–36.0)
MCV: 93.7 fl (ref 78.0–100.0)
Platelets: 245 10*3/uL (ref 150.0–400.0)
RBC: 5.29 Mil/uL (ref 4.22–5.81)
RDW: 14.5 % (ref 11.5–15.5)
WBC: 10.8 10*3/uL — ABNORMAL HIGH (ref 4.0–10.5)

## 2023-06-24 ENCOUNTER — Encounter: Payer: Self-pay | Admitting: Family Medicine

## 2023-06-25 LAB — PROTEIN ELECTROPHORESIS, URINE REFLEX
Albumin ELP, Urine: 100 %
Alpha-1-Globulin, U: 0 %
Alpha-2-Globulin, U: 0 %
Beta Globulin, U: 0 %
Gamma Globulin, U: 0 %
Protein, Ur: <4 mg/dL

## 2023-09-13 ENCOUNTER — Other Ambulatory Visit: Payer: Self-pay | Admitting: Family Medicine

## 2023-09-14 ENCOUNTER — Other Ambulatory Visit (HOSPITAL_COMMUNITY): Payer: Self-pay

## 2023-09-14 MED ORDER — ALLOPURINOL 100 MG PO TABS
200.0000 mg | ORAL_TABLET | Freq: Every day | ORAL | 3 refills | Status: AC
  Filled 2023-09-14: qty 180, 90d supply, fill #0
  Filled 2023-12-11: qty 180, 90d supply, fill #1
  Filled 2024-03-30: qty 180, 90d supply, fill #2

## 2023-12-01 ENCOUNTER — Encounter: Payer: Self-pay | Admitting: Family Medicine

## 2023-12-02 ENCOUNTER — Other Ambulatory Visit: Payer: Self-pay | Admitting: Family Medicine

## 2023-12-02 DIAGNOSIS — Z1211 Encounter for screening for malignant neoplasm of colon: Secondary | ICD-10-CM

## 2023-12-07 DIAGNOSIS — Z1211 Encounter for screening for malignant neoplasm of colon: Secondary | ICD-10-CM | POA: Diagnosis not present

## 2023-12-12 ENCOUNTER — Other Ambulatory Visit (HOSPITAL_COMMUNITY): Payer: Self-pay

## 2023-12-13 LAB — COLOGUARD: COLOGUARD: NEGATIVE

## 2023-12-14 ENCOUNTER — Ambulatory Visit: Payer: Self-pay | Admitting: Family Medicine

## 2023-12-15 ENCOUNTER — Other Ambulatory Visit (HOSPITAL_COMMUNITY): Payer: Self-pay

## 2023-12-15 ENCOUNTER — Other Ambulatory Visit: Payer: Self-pay

## 2023-12-15 ENCOUNTER — Encounter: Payer: Self-pay | Admitting: Pharmacist
